# Patient Record
Sex: Male | Born: 1990 | Race: White | Hispanic: No | Marital: Single | Smoking: Never smoker
Health system: Southern US, Community
[De-identification: ages and names within clinical notes are randomized; demographics above are authoritative.]

## PROBLEM LIST (undated history)

## (undated) DIAGNOSIS — T7840XA Allergy, unspecified, initial encounter: Secondary | ICD-10-CM

## (undated) DIAGNOSIS — F10239 Alcohol dependence with withdrawal, unspecified: Secondary | ICD-10-CM

## (undated) DIAGNOSIS — R569 Unspecified convulsions: Secondary | ICD-10-CM

## (undated) DIAGNOSIS — F10939 Alcohol use, unspecified with withdrawal, unspecified: Secondary | ICD-10-CM

## (undated) DIAGNOSIS — L709 Acne, unspecified: Secondary | ICD-10-CM

## (undated) DIAGNOSIS — F988 Other specified behavioral and emotional disorders with onset usually occurring in childhood and adolescence: Secondary | ICD-10-CM

## (undated) HISTORY — DX: Other specified behavioral and emotional disorders with onset usually occurring in childhood and adolescence: F98.8

## (undated) HISTORY — DX: Allergy, unspecified, initial encounter: T78.40XA

## (undated) HISTORY — DX: Acne, unspecified: L70.9

---
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
## 2009-05-20 ENCOUNTER — Telehealth: Payer: Self-pay | Admitting: Family Medicine

## 2010-01-23 ENCOUNTER — Ambulatory Visit: Payer: Self-pay | Admitting: Family Medicine

## 2010-06-25 ENCOUNTER — Telehealth: Payer: Self-pay | Admitting: *Deleted

## 2010-07-14 NOTE — Assessment & Plan Note (Signed)
Summary: collge cpx//ccm   Vital Signs:  Patient profile:   20 year old male Height:      69 inches Weight:      146 pounds BMI:     21.64 Temp:     98.2 degrees F oral BP sitting:   120 / 80  (left arm)  Vitals Entered By: Kathrynn Speed CMA (January 23, 2010 2:57 PM) CC: cpx, src Is Patient Diabetic? No   CC:  cpx and src.  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  He has underlying ADD for which he takes vyvanse 50 mg daily.  Medication working well.  No side effects.  Review of systems negative  Current Medications (verified): 1)  Vyvanse 50 Mg Caps (Lisdexamfetamine Dimesylate) .... Take 1 Cap  By Mouth Every Morning 2)  Vyvanse 50 Mg Caps (Lisdexamfetamine Dimesylate) .... Take One Cap By Mouth Every Morning  Fill in One Month 3)  Vyvanse 50 Mg Caps (Lisdexamfetamine Dimesylate) .... Take One Cap By Mouth Every Morning Fill in Two Months  Allergies (verified): No Known Drug Allergies  Past History:  Past medical, surgical, family and social histories (including risk factors) reviewed, and no changes noted (except as noted below).  Past Medical History: Reviewed history from 05/30/2008 and no changes required. seasonal allergies ADD adult acne  Family History: Reviewed history from 05/30/2008 and no changes required. Father: healthy Mother: healthy Siblings: healthy  Social History: Reviewed history from 05/30/2008 and no changes required. Occupation:full time student Single Never Smoked Alcohol use-yes Regular exercise-yes  Review of Systems      See HPI  Physical Exam  General:  Well-developed,well-nourished,in no acute distress; alert,appropriate and cooperative throughout examination Head:  Normocephalic and atraumatic without obvious abnormalities. No apparent alopecia or balding. Eyes:  No corneal or conjunctival  inflammation noted. EOMI. Perrla. Funduscopic exam benign, without hemorrhages, exudates or papilledema. Vision grossly normal. Ears:  External ear exam shows no significant lesions or deformities.  Otoscopic examination reveals clear canals, tympanic membranes are intact bilaterally without bulging, retraction, inflammation or discharge. Hearing is grossly normal bilaterally. Nose:  External nasal examination shows no deformity or inflammation. Nasal mucosa are pink and moist without lesions or exudates. Mouth:  Oral mucosa and oropharynx without lesions or exudates.  Teeth in good repair. Neck:  No deformities, masses, or tenderness noted. Chest Wall:  No deformities, masses, tenderness or gynecomastia noted. Breasts:  No masses or gynecomastia noted Lungs:  Normal respiratory effort, chest expands symmetrically. Lungs are clear to auscultation, no crackles or wheezes. Heart:  Normal rate and regular rhythm. S1 and S2 normal without gallop, murmur, click, rub or other extra sounds. Abdomen:  Bowel sounds positive,abdomen soft and non-tender without masses, organomegaly or hernias noted. Msk:  No deformity or scoliosis noted of thoracic or lumbar spine.   Pulses:  R and L carotid,radial,femoral,dorsalis pedis and posterior tibial pulses are full and equal bilaterally Extremities:  No clubbing, cyanosis, edema, or deformity noted with normal full range of motion of all joints.   Neurologic:  No cranial nerve deficits noted. Station and gait are normal. Plantar reflexes are down-going bilaterally. DTRs are symmetrical throughout. Sensory, motor and coordinative functions appear intact. Psych:  Cognition and judgment appear intact. Alert and cooperative with normal attention span and concentration. No apparent delusions, illusions, hallucinations   Impression & Recommendations:  Problem # 1:  ATTENTION DEFICIT DISORDER, ADULT (  ICD-314.00) Assessment Improved  Complete Medication List: 1)   Vyvanse 50 Mg Caps (Lisdexamfetamine dimesylate) .... Take 1 cap  by mouth every morning 2)  Vyvanse 50 Mg Caps (Lisdexamfetamine dimesylate) .... Take one cap by mouth every morning  fill in one month 3)  Vyvanse 50 Mg Caps (Lisdexamfetamine dimesylate) .... Take one cap by mouth every morning fill in two months  Patient Instructions: 1)  continue current medications.  Call when you are two weeks from being out of your third prescription for refills 2)  Please schedule a follow-up appointment in 1 year. Prescriptions: VYVANSE 50 MG CAPS (LISDEXAMFETAMINE DIMESYLATE) take one cap by mouth every morning fill in two months  #30 x 0   Entered and Authorized by:   Roderick Pee MD   Signed by:   Roderick Pee MD on 01/23/2010   Method used:   Print then Give to Patient   RxID:   [id number] VYVANSE 50 MG CAPS (LISDEXAMFETAMINE DIMESYLATE) take one cap by mouth every morning  fill in one month  #30 x 0   Entered and Authorized by:   Roderick Pee MD   Signed by:   Roderick Pee MD on 01/23/2010   Method used:   Print then Give to Patient   RxID:   [id number] VYVANSE 50 MG CAPS (LISDEXAMFETAMINE DIMESYLATE) Take 1 cap  by mouth every morning  #30 x 0   Entered and Authorized by:   Roderick Pee MD   Signed by:   Roderick Pee MD on 01/23/2010   Method used:   Print then Give to Patient   RxID:   [id number]

## 2010-07-16 NOTE — Progress Notes (Signed)
Summary: SWITCH TO ADDERALL  Phone Note Refill Request Call back at [phone] Message from:  Patient-MOM LOUISE  PT WOULD LIKE TO BACK TO ADDERALL NOT XR  Initial call taken by: Heron Sabins,  June 25, 2010 11:09 AM  Follow-up for Phone Call        Adderall 20 mg b.i.d., dispense 60 tabs follow-up in two weeks after starting new medication Follow-up by: Roderick Pee MD,  June 25, 2010 11:23 AM  Additional Follow-up for Phone Call Additional follow up Details #1::        Pt aware that rx is ready to pick up and will let pt know about calling in 2 weeks after starting the medication. Additional Follow-up by: Romualdo Bolk, CMA (AAMA),  June 25, 2010 2:16 PM    New/Updated Medications: ADDERALL 20 MG TABS (AMPHETAMINE-DEXTROAMPHETAMINE) 1 by mouth two times a day Prescriptions: ADDERALL 20 MG TABS (AMPHETAMINE-DEXTROAMPHETAMINE) 1 by mouth two times a day  #60 x 0   Entered by:   Romualdo Bolk, CMA (AAMA)   Authorized by:   Roderick Pee MD   Signed by:   Romualdo Bolk, CMA (AAMA) on 06/25/2010   Method used:   Print then Give to Patient   RxID:   [id number]

## 2010-09-03 ENCOUNTER — Telehealth: Payer: Self-pay | Admitting: Family Medicine

## 2010-09-03 NOTE — Telephone Encounter (Signed)
-   Refill Adderall

## 2010-09-07 MED ORDER — AMPHETAMINE-DEXTROAMPHETAMINE 20 MG PO TABS: 20.0000 mg | ORAL_TABLET | Freq: Two times a day (BID) | ORAL | Status: DC

## 2011-04-05 ENCOUNTER — Telehealth: Payer: Self-pay | Admitting: Family Medicine

## 2011-04-05 MED ORDER — AMPHETAMINE-DEXTROAMPHETAMINE 20 MG PO TABS: 20.0000 mg | ORAL_TABLET | Freq: Two times a day (BID) | ORAL | Status: DC

## 2011-04-05 NOTE — Telephone Encounter (Signed)
Please refill Adderall. Thanks.

## 2011-04-05 NOTE — Telephone Encounter (Signed)
rx ready for pick up and Left message on machine for patient 

## 2011-08-17 ENCOUNTER — Other Ambulatory Visit: Payer: Self-pay | Admitting: Family Medicine

## 2011-08-17 MED ORDER — AMPHETAMINE-DEXTROAMPHETAMINE 20 MG PO TABS: 20.0000 mg | ORAL_TABLET | Freq: Two times a day (BID) | ORAL | Status: DC

## 2011-08-17 NOTE — Telephone Encounter (Signed)
Ok   1 month

## 2011-08-17 NOTE — Telephone Encounter (Signed)
Pt needs new rx generic adderall 20 mg °

## 2011-08-17 NOTE — Telephone Encounter (Signed)
Patient has not been seen since 2011 okay to fill?

## 2011-08-17 NOTE — Telephone Encounter (Signed)
Left detailed message on machine for Mom that an office visit is required for more refills and Rx is ready for pick up

## 2012-01-05 ENCOUNTER — Encounter: Payer: Self-pay | Admitting: Family Medicine

## 2012-01-10 ENCOUNTER — Encounter: Payer: Self-pay | Admitting: Family Medicine

## 2012-01-10 ENCOUNTER — Ambulatory Visit (INDEPENDENT_AMBULATORY_CARE_PROVIDER_SITE_OTHER): Payer: BC Managed Care – PPO | Admitting: Family Medicine

## 2012-01-10 VITALS — BP 120/80 | Temp 98.3°F | Ht 70.0 in | Wt 153.0 lb

## 2012-01-10 DIAGNOSIS — F988 Other specified behavioral and emotional disorders with onset usually occurring in childhood and adolescence: Secondary | ICD-10-CM

## 2012-01-10 MED ORDER — AMPHETAMINE-DEXTROAMPHETAMINE 20 MG PO TABS: 20.0000 mg | ORAL_TABLET | Freq: Two times a day (BID) | ORAL | Status: DC

## 2012-01-10 NOTE — Progress Notes (Signed)
  Subjective:    Patient ID: Jack Gonzales, male    DOB: 1991/01/04, 21 y.o.   MRN: [id number]  HPI Jack Gonzales is a . 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  He takes 20 mg twice a day and is doing well and wishes to continue that dose no side effects. He sleeps well.    Review of Systems    general and psychiatric review of systems otherwise negative Objective:   Physical Exam Well-developed well-nourished male in no acute distress       Assessment & Plan:

## 2012-01-10 NOTE — Patient Instructions (Addendum)
Continue the Adderall 20 mg twice daily  When you are 2 weeks from being out of your third prescription call and leave a voicemail for Fleet Contras that you need refills.

## 2012-07-18 ENCOUNTER — Other Ambulatory Visit: Payer: Self-pay | Admitting: Family Medicine

## 2012-07-18 DIAGNOSIS — F988 Other specified behavioral and emotional disorders with onset usually occurring in childhood and adolescence: Secondary | ICD-10-CM

## 2012-07-18 MED ORDER — AMPHETAMINE-DEXTROAMPHETAMINE 20 MG PO TABS: 20.0000 mg | ORAL_TABLET | Freq: Two times a day (BID) | ORAL | Status: DC

## 2012-07-18 MED ORDER — AMPHETAMINE-DEXTROAMPHETAMINE 20 MG PO TABS: 20.0000 mg | ORAL_TABLET | Freq: Two times a day (BID) | ORAL | Status: AC

## 2012-07-18 NOTE — Telephone Encounter (Signed)
Rx ready for pick up.  Left message on machine for patient. 

## 2012-07-18 NOTE — Telephone Encounter (Signed)
Pt needs new rx generic adderall 20 mg. Pt gets 3 rxs at a time

## 2014-08-20 ENCOUNTER — Telehealth: Payer: Self-pay | Admitting: Family Medicine

## 2014-08-21 NOTE — Telephone Encounter (Signed)
Error/njr °

## 2019-02-17 ENCOUNTER — Other Ambulatory Visit: Payer: Self-pay

## 2019-02-17 ENCOUNTER — Inpatient Hospital Stay (HOSPITAL_COMMUNITY)
Admission: EM | Admit: 2019-02-17 | Discharge: 2019-02-22 | DRG: 057 | Disposition: A | Discharge status: Home / Self Care | Payer: Self-pay | Attending: Internal Medicine | Admitting: Internal Medicine

## 2019-02-17 ENCOUNTER — Encounter (HOSPITAL_COMMUNITY): Payer: Self-pay

## 2019-02-17 ENCOUNTER — Emergency Department (HOSPITAL_COMMUNITY): Payer: Self-pay

## 2019-02-17 DIAGNOSIS — F10230 Alcohol dependence with withdrawal, uncomplicated: Secondary | ICD-10-CM

## 2019-02-17 DIAGNOSIS — E876 Hypokalemia: Secondary | ICD-10-CM

## 2019-02-17 DIAGNOSIS — F1010 Alcohol abuse, uncomplicated: Secondary | ICD-10-CM

## 2019-02-17 DIAGNOSIS — G312 Degeneration of nervous system due to alcohol: Principal | ICD-10-CM | POA: Diagnosis present

## 2019-02-17 DIAGNOSIS — K7010 Alcoholic hepatitis without ascites: Secondary | ICD-10-CM | POA: Diagnosis present

## 2019-02-17 DIAGNOSIS — F988 Other specified behavioral and emotional disorders with onset usually occurring in childhood and adolescence: Secondary | ICD-10-CM

## 2019-02-17 DIAGNOSIS — I4510 Unspecified right bundle-branch block: Secondary | ICD-10-CM | POA: Diagnosis present

## 2019-02-17 DIAGNOSIS — R569 Unspecified convulsions: Secondary | ICD-10-CM

## 2019-02-17 DIAGNOSIS — Z20828 Contact with and (suspected) exposure to other viral communicable diseases: Secondary | ICD-10-CM | POA: Diagnosis present

## 2019-02-17 DIAGNOSIS — F10931 Alcohol use, unspecified with withdrawal delirium: Secondary | ICD-10-CM

## 2019-02-17 DIAGNOSIS — F10231 Alcohol dependence with withdrawal delirium: Secondary | ICD-10-CM | POA: Diagnosis present

## 2019-02-17 DIAGNOSIS — Z803 Family history of malignant neoplasm of breast: Secondary | ICD-10-CM

## 2019-02-17 HISTORY — DX: Unspecified convulsions: R56.9

## 2019-02-17 HISTORY — DX: Alcohol dependence with withdrawal, unspecified: F10.239

## 2019-02-17 HISTORY — DX: Alcohol use, unspecified with withdrawal, unspecified: F10.939

## 2019-02-17 LAB — BASIC METABOLIC PANEL
Anion gap: 15 (ref 5–15)
BUN: 15 mg/dL (ref 6–20)
CO2: 25 mmol/L (ref 22–32)
Calcium: 9.2 mg/dL (ref 8.9–10.3)
Chloride: 96 mmol/L — ABNORMAL LOW (ref 98–111)
Creatinine, Ser: 0.73 mg/dL (ref 0.61–1.24)
GFR calc Af Amer: >60 mL/min (ref >60)
GFR calc non Af Amer: >60 mL/min (ref >60)
Glucose, Bld: 85 mg/dL (ref 70–99)
Potassium: 3.3 mmol/L — ABNORMAL LOW (ref 3.5–5.1)
Sodium: 136 mmol/L (ref 135–145)

## 2019-02-17 LAB — HEPATIC FUNCTION PANEL
ALT: 57 U/L — ABNORMAL HIGH (ref 0–44)
AST: 72 U/L — ABNORMAL HIGH (ref 15–41)
Albumin: 5.3 g/dL — ABNORMAL HIGH (ref 3.5–5.0)
Alkaline Phosphatase: 53 U/L (ref 38–126)
Bilirubin, Direct: 0.2 mg/dL (ref 0.0–0.2)
Indirect Bilirubin: 1.4 mg/dL — ABNORMAL HIGH (ref 0.3–0.9)
Total Bilirubin: 1.6 mg/dL — ABNORMAL HIGH (ref 0.3–1.2)
Total Protein: 8.6 g/dL — ABNORMAL HIGH (ref 6.5–8.1)

## 2019-02-17 LAB — RAPID URINE DRUG SCREEN, HOSP PERFORMED
Amphetamines: POSITIVE — AB
Barbiturates: NOT DETECTED
Benzodiazepines: NOT DETECTED
Cocaine: NOT DETECTED
Opiates: NOT DETECTED
Tetrahydrocannabinol: NOT DETECTED

## 2019-02-17 LAB — CBC
HCT: 41.3 % (ref 39.0–52.0)
Hemoglobin: 13.9 g/dL (ref 13.0–17.0)
MCH: 32.6 pg (ref 26.0–34.0)
MCHC: 33.7 g/dL (ref 30.0–36.0)
MCV: 96.9 fL (ref 80.0–100.0)
Platelets: 131 10*3/uL — ABNORMAL LOW (ref 150–400)
RBC: 4.26 MIL/uL (ref 4.22–5.81)
RDW: 11.9 % (ref 11.5–15.5)
WBC: 6.6 10*3/uL (ref 4.0–10.5)
nRBC: 0 % (ref 0.0–0.2)

## 2019-02-17 LAB — CBG MONITORING, ED: Glucose-Capillary: 85 mg/dL (ref 70–99)

## 2019-02-17 LAB — MAGNESIUM: Magnesium: 2.1 mg/dL (ref 1.7–2.4)

## 2019-02-17 LAB — LIPASE, BLOOD: Lipase: 34 U/L (ref 11–51)

## 2019-02-17 LAB — ETHANOL: Alcohol, Ethyl (B): <10 mg/dL (ref <10)

## 2019-02-17 MED ORDER — ENOXAPARIN SODIUM 40 MG/0.4ML ~~LOC~~ SOLN
40.0000 mg | SUBCUTANEOUS | Status: DC
  Administered 2019-02-17 – 2019-02-21 (×5): 40 mg via SUBCUTANEOUS
  Filled 2019-02-17 (×5): qty 0.4

## 2019-02-17 MED ORDER — LORAZEPAM 2 MG/ML IJ SOLN
0.0000 mg | Freq: Four times a day (QID) | INTRAMUSCULAR | Status: DC
  Administered 2019-02-17: 16:00:00 1 mg via INTRAVENOUS
  Filled 2019-02-17: qty 1

## 2019-02-17 MED ORDER — THIAMINE HCL 100 MG/ML IJ SOLN
100.0000 mg | Freq: Every day | INTRAMUSCULAR | Status: DC
  Filled 2019-02-17: qty 2

## 2019-02-17 MED ORDER — LORAZEPAM 1 MG PO TABS: 0.0000 mg | ORAL_TABLET | Freq: Two times a day (BID) | ORAL | Status: DC

## 2019-02-17 MED ORDER — LORAZEPAM 2 MG/ML IJ SOLN: 0.0000 mg | Freq: Two times a day (BID) | INTRAMUSCULAR | Status: DC

## 2019-02-17 MED ORDER — ACETAMINOPHEN 650 MG RE SUPP: 650.0000 mg | RECTAL | Status: DC | PRN

## 2019-02-17 MED ORDER — CHLORDIAZEPOXIDE HCL 25 MG PO CAPS: ORAL_CAPSULE | ORAL | 0 refills | Status: DC

## 2019-02-17 MED ORDER — POTASSIUM CHLORIDE IN NACL 20-0.9 MEQ/L-% IV SOLN
INTRAVENOUS | Status: AC
  Administered 2019-02-17 – 2019-02-18 (×2): via INTRAVENOUS
  Filled 2019-02-17 (×2): qty 1000

## 2019-02-17 MED ORDER — LORAZEPAM 1 MG PO TABS
0.0000 mg | ORAL_TABLET | Freq: Four times a day (QID) | ORAL | Status: DC
  Administered 2019-02-17: 2 mg via ORAL
  Filled 2019-02-17: qty 2

## 2019-02-17 MED ORDER — ADULT MULTIVITAMIN W/MINERALS CH
1.0000 | ORAL_TABLET | Freq: Every day | ORAL | Status: DC
  Administered 2019-02-17 – 2019-02-22 (×6): 1 via ORAL
  Filled 2019-02-17 (×6): qty 1

## 2019-02-17 MED ORDER — VITAMIN B-1 100 MG PO TABS
100.0000 mg | ORAL_TABLET | Freq: Every day | ORAL | Status: DC
  Administered 2019-02-17 – 2019-02-22 (×6): 100 mg via ORAL
  Filled 2019-02-17 (×7): qty 1

## 2019-02-17 MED ORDER — POTASSIUM CHLORIDE CRYS ER 20 MEQ PO TBCR
40.0000 meq | EXTENDED_RELEASE_TABLET | Freq: Once | ORAL | Status: AC
  Administered 2019-02-17: 40 meq via ORAL
  Filled 2019-02-17: qty 2

## 2019-02-17 MED ORDER — LORAZEPAM 2 MG/ML IJ SOLN
2.0000 mg | Freq: Once | INTRAMUSCULAR | Status: DC
  Filled 2019-02-17: qty 1

## 2019-02-17 MED ORDER — DIAZEPAM 5 MG/ML IJ SOLN
INTRAMUSCULAR | Status: AC
  Filled 2019-02-17: qty 2

## 2019-02-17 MED ORDER — DIAZEPAM 5 MG/ML IJ SOLN
10.0000 mg | Freq: Once | INTRAMUSCULAR | Status: AC | PRN
  Administered 2019-02-19: 10 mg via INTRAVENOUS
  Filled 2019-02-17: qty 2

## 2019-02-17 MED ORDER — ACETAMINOPHEN 325 MG PO TABS: 650.0000 mg | ORAL_TABLET | ORAL | Status: DC | PRN

## 2019-02-17 MED ORDER — SODIUM CHLORIDE 0.9 % IV BOLUS
1000.0000 mL | Freq: Once | INTRAVENOUS | Status: AC
  Administered 2019-02-17: 1000 mL via INTRAVENOUS

## 2019-02-17 MED ORDER — LORAZEPAM 2 MG/ML IJ SOLN
2.0000 mg | INTRAMUSCULAR | Status: DC | PRN
  Administered 2019-02-17: 2 mg via INTRAVENOUS

## 2019-02-17 MED ORDER — DIAZEPAM 5 MG/ML IJ SOLN
5.0000 mg | Freq: Once | INTRAMUSCULAR | Status: AC
  Administered 2019-02-17: 5 mg via INTRAVENOUS
  Filled 2019-02-17: qty 2

## 2019-02-17 MED ORDER — DEXMEDETOMIDINE HCL IN NACL 200 MCG/50ML IV SOLN
0.2000 ug/kg/h | INTRAVENOUS | Status: DC
  Administered 2019-02-17 – 2019-02-19 (×3): 0.2 ug/kg/h via INTRAVENOUS
  Filled 2019-02-17 (×3): qty 50

## 2019-02-17 MED ORDER — FOLIC ACID 1 MG PO TABS
1.0000 mg | ORAL_TABLET | Freq: Every day | ORAL | Status: DC
  Administered 2019-02-17 – 2019-02-22 (×6): 1 mg via ORAL
  Filled 2019-02-17 (×6): qty 1

## 2019-02-17 MED ORDER — LORAZEPAM 2 MG/ML IJ SOLN
2.0000 mg | Freq: Two times a day (BID) | INTRAMUSCULAR | Status: DC
  Administered 2019-02-17 – 2019-02-18 (×3): 2 mg via INTRAVENOUS
  Filled 2019-02-17 (×3): qty 1

## 2019-02-17 NOTE — ED Provider Notes (Signed)
Medical screening examination/treatment/procedure(s) were conducted as a shared visit with non-physician practitioner(s) and myself.  I personally evaluated the patient during the encounter.  EKG Interpretation  Date/Time:  Saturday February 17 2019 13:56:23 EDT Ventricular Rate:  101 PR Interval:    QRS Duration: 115 QT Interval:  373 QTC Calculation: 484 R Axis:   90 Text Interpretation:  Sinus tachycardia Incomplete right bundle branch block ST elev, probable normal early repol pattern No old tracing to compare Confirmed by Dorie Rank [id number]) on 02/17/2019 2:07:03 PM  Patient presents with new onset seizure.  Patient denies any drug use.  He is on Adderall for ADHD.  Patient appears tremulous somewhat suggestive of alcohol withdrawal however he he states he drinks 1 or 2 alcoholic beverages every other day.  He denies heavy alcohol use.  Will check labs, CT, monitor.   Dorie Rank, MD 02/17/19 1450

## 2019-02-17 NOTE — Progress Notes (Signed)
Spoke with patient's mom via phone. Updated on patient's condition and all questions answered. Notified her that patient is in soft wrist and soft belt restraints for his own safety due to him attempting to get out of bed multiple times. Very unsteady gait & severe tremors noted. Less restrictive attempts done with no success (see restraint charting). Patient's mom expressed understanding. Will continue to monitor patient.

## 2019-02-17 NOTE — ED Notes (Signed)
Pt states does not have the urge to urinate at the current moment. Urinal provided for collection of urine sample

## 2019-02-17 NOTE — ED Triage Notes (Signed)
Patient arrived via POV with girlfriend. Patient had seizure lasting about 2.5 minutes and does not have history of seizures. Patient hit head on root following seizure. Patient also began to have stomach pain which began this past Wednesday, and continued up to seizure. Girlfriend at bedside. Patient is Alert and oriented at this time.

## 2019-02-17 NOTE — ED Provider Notes (Signed)
Pt has been admitted to the medical service.  Pt seems to be getting more confused.  He is standing up at the bedside trying to pick at his leads.  Pt instructed to get back in bed.  2 mg ativan IV  Ordered.  Asked RN to contact the admitting MD, Dr Posey Pronto.  Pt will likely need to be moved up to ICU care.   Dorie Rank, MD 02/17/19 2033

## 2019-02-17 NOTE — H&P (Signed)
History and Physical    Jack Gonzales RSW:[id number] DOB: [birth year] DOA: 02/17/2019  PCP: Merleen Nicely, MD  Patient coming from: Home  I have personally briefly reviewed patient's old medical records in [place]  Chief Complaint: Seizure activity  HPI: Jack Gonzales is a 28 y.o. male with medical history significant for ADD who presents the ED for evaluation after reported seizure episode.  Patient states he was in the [place] camping.  Last night he began to notice having shakes with noticeable tremors in his hands.  This morning he woke up and was walking outside when he began to feel off balance.  He says he turned around talk to his girlfriend when he suddenly fell to the ground.  He was reportedly witnessed to have vomiting and regurgitation in his mouth and diffuse conversations of his body.  This apparently lasted for approximately 2 minutes before he woke up.  Upon waking he felt confused and was saying nonsensical things.  He denied any loss of bowel or bladder control.  He does report recent possible auditory hallucinations.  He reportedly hit his head on a large tree root when he fell.  He denies any personal history of seizures.  He has a history of ADD for which he has been taking Adderall since he was a child.  He reports alcohol use of approximately 3 drinks every other day with occasional increased consumption on social occasions.  He says his last drink was 2 days ago.  He denies any history of tobacco use.  ED Course:  Initial vitals show BP 135/96, pulse 100, RR 20, temp 98.0 Fahrenheit, SPO2 100% on room air.  Labs notable for potassium 3.3, magnesium 2.1, sodium 136, bicarb 25, BUN 15, creatinine 0.73, serum glucose 85, WBC 6.6, hemoglobin 13.9, platelets 131,000, AST 72, ALT 57, alk phos 53, total bilirubin 1.6, lipase 34.  Serum ethanol level undetectable.  UDS positive for amphetamines (patient is on Adderall).  CT head without contrast was negative for  acute infarction, hemorrhage, hydrocephalus, or mass lesion/mass-effect.  Patient was given 1 L normal saline, K-Dur 40 mEq, and IV Ativan 1 mg.  The hospitalist service was consulted admit for further evaluation management of suspected alcohol withdrawal seizure.  Review of Systems: All systems reviewed and are negative except as documented in history of present illness above.   Past Medical History:  Diagnosis Date  . ADD (attention deficit disorder)   . Adult acne   . Alcohol withdrawal seizure ([place])   . Allergy     History reviewed. No pertinent surgical history.  Social History:  reports that he has never smoked. He has never used smokeless tobacco. He reports current alcohol use. He reports that he does not use drugs.  No Known Allergies  Family History  Problem Relation Age of Onset  . Breast cancer Mother      Prior to Admission medications   Medication Sig Start Date End Date Taking? Authorizing Provider  amphetamine-dextroamphetamine (ADDERALL) 20 MG tablet Take 1 tablet (20 mg total) by mouth 2 (two) times daily. 07/18/12 02/17/19 Yes Dorena Cookey, MD  chlordiazePOXIDE (LIBRIUM) 25 MG capsule 51m PO TID x 1D, then 25-52mPO BID X 1D, then 25-506mO QD X 1D 02/17/19   VenEustaquio MaizeA-C    Physical Exam: Vitals:   02/17/19 1800 02/17/19 1830 02/17/19 1900 02/17/19 1951  BP: 106/70 (!) 152/98 (!) 154/108 (!) 150/99  Pulse: 94  92 (!) 109  Resp:  14 15 16  (!) 22  Temp:      TempSrc:      SpO2: 99% 100% 100% 100%  Weight:      Height:        Constitutional: Resting in bed, tremulous, NAD, calm, comfortable Eyes: PERRL, EOMI, lids and conjunctivae normal ENMT: Mucous membranes are moist. Posterior pharynx clear of any exudate or lesions.Normal dentition.  Neck: normal, supple, no masses. Respiratory: clear to auscultation bilaterally, no wheezing, no crackles. Normal respiratory effort. No accessory muscle use.  Cardiovascular: Regular rate and rhythm, no  murmurs / rubs / gallops. No extremity edema. 2+ pedal pulses. Abdomen: no tenderness, no masses palpated. No hepatosplenomegaly. Bowel sounds positive.  Musculoskeletal: no clubbing / cyanosis. No joint deformity upper and lower extremities. Good ROM, no contractures. Normal muscle tone.  Skin: Diaphoretic, multiple small lacerations/abrasions of the anterior lower legs Neurologic: CN 2-12 grossly intact. Sensation intact, Strength 5/5 in all 4, tremulous without dysmetria or dysdiadochokinesia.  Psychiatric: Alert and oriented x 3. Normal mood.     Labs on Admission: I have personally reviewed following labs and imaging studies  CBC: Recent Labs  Lab 02/17/19 1429  WBC 6.6  HGB 13.9  HCT 41.3  MCV 96.9  PLT 962*   Basic Metabolic Panel: Recent Labs  Lab 02/17/19 1429 02/17/19 1735  NA 136  --   K 3.3*  --   CL 96*  --   CO2 25  --   GLUCOSE 85  --   BUN 15  --   CREATININE 0.73  --   CALCIUM 9.2  --   MG  --  2.1   GFR: Estimated Creatinine Clearance: 139.9 mL/min (by C-G formula based on SCr of 0.73 mg/dL). Liver Function Tests: Recent Labs  Lab 02/17/19 1444  AST 72*  ALT 57*  ALKPHOS 53  BILITOT 1.6*  PROT 8.6*  ALBUMIN 5.3*   Recent Labs  Lab 02/17/19 1444  LIPASE 34   No results for input(s): AMMONIA in the last 168 hours. Coagulation Profile: No results for input(s): INR, PROTIME in the last 168 hours. Cardiac Enzymes: No results for input(s): CKTOTAL, CKMB, CKMBINDEX, TROPONINI in the last 168 hours. BNP (last 3 results) No results for input(s): PROBNP in the last 8760 hours. HbA1C: No results for input(s): HGBA1C in the last 72 hours. CBG: Recent Labs  Lab 02/17/19 1356  GLUCAP 85   Lipid Profile: No results for input(s): CHOL, HDL, LDLCALC, TRIG, CHOLHDL, LDLDIRECT in the last 72 hours. Thyroid Function Tests: No results for input(s): TSH, T4TOTAL, FREET4, T3FREE, THYROIDAB in the last 72 hours. Anemia Panel: No results for  input(s): VITAMINB12, FOLATE, FERRITIN, TIBC, IRON, RETICCTPCT in the last 72 hours. Urine analysis: No results found for: COLORURINE, APPEARANCEUR, LABSPEC, [place], GLUCOSEU, HGBUR, BILIRUBINUR, KETONESUR, PROTEINUR, UROBILINOGEN, NITRITE, LEUKOCYTESUR  Radiological Exams on Admission: Ct Head Wo Contrast  Result Date: 02/17/2019 CLINICAL DATA:  Seizure EXAM: CT HEAD WITHOUT CONTRAST TECHNIQUE: Contiguous axial images were obtained from the base of the skull through the vertex without intravenous contrast. COMPARISON:  None. FINDINGS: Brain: No evidence of acute infarction, hemorrhage, hydrocephalus, extra-axial collection or mass lesion/mass effect. Vascular: No hyperdense vessel or unexpected calcification. Skull: Normal. Negative for fracture or focal lesion. Sinuses/Orbits: Minimal mucosal thickening within the inferior left maxillary sinus. The visualized paranasal sinuses and mastoid air cells are otherwise clear. Orbital structures unremarkable. Other: None. IMPRESSION: No acute intracranial findings. Electronically Signed   By: Davina Poke M.D.   On:  02/17/2019 15:41    EKG: Independently reviewed. Sinus tachycardia, early repolarization changes, no prior for comparison.  Assessment/Plan Principal Problem:   Seizure [facility]) Active Problems:   ADD (attention deficit disorder)  GERELL FORTSON is a 28 y.o. male with medical history significant for ADD who is admitted after a first-time seizure episode.  Seizure: First episode without prior history. Questionable alcohol withdrawal induced seizure.  Patient is also on Adderall which can lower seizure threshold.  CT head was negative for acute intracranial abnormality.  Patient remains tremulous and diaphoretic.  Also becoming more confused.  Discussed with neurology, will schedule Ativan 2 mg IV every 12 hours, continue CIWA protocol with as needed Ativan, hold Adderall.  Patient will need to follow-up with neurology outpatient and should  not drive for neck 6 months. -Admit to stepdown unit -Schedule IV Ativan 2 mg every 12 hours -Continue CIWA protocol with as needed Ativan -Seizure precautions  Alcohol use: Continue CIWA protocol as above with thiamine, folate, multivitamin.  ADD: Has been taking Adderall 20 mg twice daily.  Will hold for now given seizure episode.  DVT prophylaxis: Lovenox Code Status: Full code, confirmed with patient Family Communication: Discussed with patient Disposition Plan: Pending clinical progress Consults called: Discussed with neurology by phone Admission status: Observation   Zada Finders MD [facility]  If 7PM-7AM, please contact night-coverage [email or web address]  02/17/2019, 8:51 PM

## 2019-02-17 NOTE — Discharge Instructions (Addendum)
Please follow up with your PCP regarding your new onset seizure. You may also follow up with your father's neurologist if they are able to get you in.

## 2019-02-17 NOTE — ED Provider Notes (Addendum)
[facility]-EMERGENCY DEPT Provider Note   CSN: [id number] Arrival date & time: 02/17/19  1323     History   Chief Complaint Chief Complaint  Patient presents with  . Seizures  . Abdominal Pain  . Fall    HPI Jack Gonzales is a 28 y.o. male with PMHx ADD who presents to the ED for new onset seizure.  Per girlfriend they were hiking last night.  When patient woke up this morning he got out of his tent and reports that he fell very off balance.  He began looking at girlfriend when she noted that he dropped to the ground.  Patient did hit his head on a large tree root.  She reports that after falling he began having diffuse convulsing of his body.  She indicates that he was doing this for about 2 minutes before coming to.  She states that after coming to patient seemed very confused.  He was not making much sense when he was talking.  She states that he appeared very drowsy and wanted to go back to sleep but she knew she needed to call 911 for help.  Patient reports that he felt "off" yesterday and has been having some intermittent periumbilical abdominal pain since Wednesday.  No nausea or vomiting, no diarrhea.  Has never had seizures in the past.  He is currently complaining of some mild headache but otherwise has no complaints.  Patient states that he does drink alcohol every other day.  He states he has not drank anything since about Wednesday.  He does endorse occasional marijuana use.  Denies any other illicit drugs.  No known recent sick contacts.  No new rashes.  No tick exposure.  Denies fever, chills, chest pain, shortness of breath, neck stiffness, any other associated symptoms.      The history is provided by the patient and a friend.    Past Medical History:  Diagnosis Date  . ADD (attention deficit disorder)   . Adult acne   . Allergy     Patient Active Problem List   Diagnosis Date Noted  . CLOSED FRACTURE OF NECK OF METACARPAL BONE 10/22/2008  .  CONTUSION, RIGHT HAND 10/19/2008  . ATTENTION DEFICIT DISORDER, ADULT 05/30/2008  . 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 No pertinent surgical history.      Home Medications    Prior to Admission medications   Medication Sig Start Date End Date Taking? Authorizing Provider  amphetamine-dextroamphetamine (ADDERALL) 20 MG tablet Take 1 tablet (20 mg total) by mouth 2 (two) times daily. 07/18/12 02/17/19 Yes Roderick Peeodd, Jeffrey A, MD    Family History History reviewed. No pertinent family history.  Social History Social History   Tobacco Use  . Smoking status: Never Smoker  . Smokeless tobacco: Never Used  Substance Use Topics  . Alcohol use: Yes    Comment: occasional  . Drug use: Never     Allergies   Patient has no known allergies.   Review of Systems Review of Systems  Constitutional: Negative for chills and fever.  HENT: Negative for congestion.   Eyes: Negative for visual disturbance.  Respiratory: Negative for cough and shortness of breath.   Cardiovascular: Negative for chest pain.  Gastrointestinal: Positive for abdominal pain. Negative for constipation, diarrhea, nausea and vomiting.  Genitourinary: Negative for difficulty urinating.  Musculoskeletal: Negative for myalgias.  Skin: Negative for rash.  Neurological: Positive for seizures and headaches.     Physical Exam Updated  Vital Signs BP (!) 151/101   Pulse 95   Temp 98 F (36.7 C) (Oral)   Resp 16   Ht 5\' 10"  (1.778 m)   Wt 72.6 kg   SpO2 100%   BMI 22.96 kg/m   Physical Exam Vitals signs and nursing note reviewed.  Constitutional:      Appearance: He is not ill-appearing.     Comments: Tremulous  HENT:     Head: Normocephalic and atraumatic.     Mouth/Throat:     Comments: No lesions noted to tongue or oral mucosa Eyes:     Extraocular Movements: Extraocular movements intact.     Conjunctiva/sclera: Conjunctivae normal.     Pupils: Pupils are equal, round, and reactive to  light.  Neck:     Musculoskeletal: Neck supple. No neck rigidity.     Meningeal: Brudzinski's sign and Kernig's sign absent.  Cardiovascular:     Rate and Rhythm: Normal rate and regular rhythm.     Heart sounds: Normal heart sounds.  Pulmonary:     Effort: Pulmonary effort is normal.     Breath sounds: Normal breath sounds. No wheezing, rhonchi or rales.  Abdominal:     Palpations: Abdomen is soft.     Tenderness: There is no abdominal tenderness.     Comments: Soft, NTND, +BS throughout, no r/g/r, neg murphy's, neg mcburney's, no CVA TTP   Skin:    General: Skin is warm and dry.  Neurological:     Mental Status: He is alert.     Comments: CN 3-12 grossly intact A&O x4; does hesitate when asking about the year but eventually states that it is 2020. GCS 15 Sensation and strength intact Coordination with finger-to-nose WNL although pt is having tremor with coordinating hand movements Neg pronator drift       ED Treatments / Results  Labs (all labs ordered are listed, but only abnormal results are displayed) Labs Reviewed  BASIC METABOLIC PANEL - Abnormal; Notable for the following components:      Result Value   Potassium 3.3 (*)    Chloride 96 (*)    All other components within normal limits  CBC - Abnormal; Notable for the following components:   Platelets 131 (*)    All other components within normal limits  HEPATIC FUNCTION PANEL - Abnormal; Notable for the following components:   Total Protein 8.6 (*)    Albumin 5.3 (*)    AST 72 (*)    ALT 57 (*)    Total Bilirubin 1.6 (*)    Indirect Bilirubin 1.4 (*)    All other components within normal limits  ETHANOL  LIPASE, BLOOD  RAPID URINE DRUG SCREEN, HOSP PERFORMED  MAGNESIUM  CBG MONITORING, ED    EKG EKG Interpretation  Date/Time:  Saturday February 17 2019 13:56:23 EDT Ventricular Rate:  101 PR Interval:    QRS Duration: 115 QT Interval:  373 QTC Calculation: 484 R Axis:   90 Text Interpretation:   Sinus tachycardia Incomplete right bundle branch block ST elev, probable normal early repol pattern No old tracing to compare Confirmed by Dorie Rank [phone]) on 02/17/2019 2:07:03 PM   Radiology Ct Head Wo Contrast  Result Date: 02/17/2019 CLINICAL DATA:  Seizure EXAM: CT HEAD WITHOUT CONTRAST TECHNIQUE: Contiguous axial images were obtained from the base of the skull through the vertex without intravenous contrast. COMPARISON:  None. FINDINGS: Brain: No evidence of acute infarction, hemorrhage, hydrocephalus, extra-axial collection or mass lesion/mass effect. Vascular: No hyperdense  vessel or unexpected calcification. Skull: Normal. Negative for fracture or focal lesion. Sinuses/Orbits: Minimal mucosal thickening within the inferior left maxillary sinus. The visualized paranasal sinuses and mastoid air cells are otherwise clear. Orbital structures unremarkable. Other: None. IMPRESSION: No acute intracranial findings. Electronically Signed   By: Duanne Guess M.D.   On: 02/17/2019 15:41    Procedures Procedures (including critical care time)  Medications Ordered in ED Medications  LORazepam (ATIVAN) injection 0-4 mg (1 mg Intravenous Given 02/17/19 1611)    Or  LORazepam (ATIVAN) tablet 0-4 mg ( Oral See Alternative 02/17/19 1611)  LORazepam (ATIVAN) injection 0-4 mg (has no administration in time range)    Or  LORazepam (ATIVAN) tablet 0-4 mg (has no administration in time range)  thiamine (VITAMIN B-1) tablet 100 mg (100 mg Oral Given 02/17/19 1634)    Or  thiamine (B-1) injection 100 mg ( Intravenous See Alternative 02/17/19 1634)  potassium chloride SA (K-DUR) CR tablet 40 mEq (40 mEq Oral Given 02/17/19 1634)  sodium chloride 0.9 % bolus 1,000 mL (1,000 mLs Intravenous New Bag/Given (Non-Interop) 02/17/19 1710)     Initial Impression / Assessment and Plan / ED Course  I have reviewed the triage vital signs and the nursing notes.  Pertinent labs & imaging results that were available during my  care of the patient were reviewed by me and considered in my medical decision making (see chart for details).    28 year old male who presents to the ED with new onset seizure.  Did hit head with fall.  Patient is tremulous on exam to bilateral upper and lower extremities.  Girl friend reports that this is new.  Endorses alcohol use every other day but none since last Wednesday.  That he typically drinks about 1-2 beers or mixed drink every other day.  Patient is on Adderall for his ADHD.  He also smokes marijuana occasionally.  Obtain baseline blood work today and CT head, UDS, EtOH as well.  We will continue to monitor in the ED today.  Regarding abdominal pain-he has no abdominal tenderness on exam today.  Will check CMP, lipase as well. Discussed case with attending physician Dr. Lynelle Doctor who agrees with plan at this time.   Lab work shows a mild hypokalemia at 3.3.  Will replete with K-Dur today.  No leukocytosis.  Hemoglobin stable.  No other electrolyte abnormalities.  Creatinine within normal limits.  AST/ALT and T bili very mildly elevated.  This consistent with patient's drinking history.  Call level today unremarkable.  Lipase negative.  Still awaiting UDS.  CT head negative.  Upon reevaluation of patient, patient's father is in the room.  He reports that they saw him last weekend and patient had some tremors at that point although it appears much worse today.  Father reports that this has been somewhat of an ongoing issue for "a while".  Denies any recent increase or decrease in his Adderall.  No recent medication changes.  All in the room patient's heart rate tachycardic into the 110s.  Will give IV fluids at this time.  Patient has already received some Ativan and thiamine per CIWA protocol.    We will continue to monitor patient/continue tracking CIWA score and given meds as needed.  If heart rate is able to decrease back into the 90s feel patient is safe for discharge.  Patient lives in the  [place] area.  Father reports that they know a neurologist in the area that they can follow-up with.  Feel this  is appropriate.  Patient also has a PCP and I have recommended they can follow-up with them as well.   5:19 PM At shift change case signed out to Lyndel SafeElizabeth Hammond, PA-C, who will dispo patient accordingly. Awaiting UDS at this time. Pt receiving fluids currently for elevated HR. He will need [place] [facility] [facility] [place] [facility] or his father's neurologist. Have prescribed librium taper for patient.           Final Clinical Impressions(s) / ED Diagnoses   Final diagnoses:  Seizure (HCC)  Hypokalemia    ED Discharge Orders         Ordered    chlordiazePOXIDE (LIBRIUM) 25 MG capsule     Pending           Tanda RockersVenter, Teren Franckowiak, PA-C 02/17/19 1720    Tanda RockersVenter, Hara Milholland, PA-C 02/17/19 Lunette Stands1723    Knapp, Jon, MD 02/26/19 1025

## 2019-02-17 NOTE — ED Provider Notes (Signed)
I assumed care of patient [facility] Venter PA-C, please see her note for full H&P.  Briefly patient presents for new onset seizure.  When patient got up this morning he had a seizure for approximately 2 minutes.  He does not have a history.    Initially he had denied alcohol use.  When I asked his family to step out we discussed that his lab values appear consistent with alcohol consumption and asked him if it was possible that he actually drinks more than he had previously stated.  He reports that he does drink more than 2 drinks every day.  He states that in the past he has gotten shakes and felt anxious when he stops drinking however denies any previous seizures.    He requested that his alcohol use be not discussed in front of his family.  After discussions with patient I had his father stop by again and told him that there were lab abnormalities raising concern that he may have additional seizures and that he would be admitted.  I conveyed this to patient's primary nurse.    Physical Exam  BP 140/74   Pulse 100   Temp 98 F (36.7 C) (Oral)   Resp 19   Ht 5\' 10"  (1.778 m)   Wt 72.6 kg   SpO2 99%   BMI 22.96 kg/m   Physical Exam Constitutional:      General: He is not in acute distress.    Appearance: He is not diaphoretic.  Cardiovascular:     Rate and Rhythm: Tachycardia present.  Skin:    General: Skin is warm and dry.  Neurological:     Mental Status: He is alert.     Comments: Very tremulous     ED Course/Procedures   Clinical Course as of Feb 18 108  Sat Feb 17, 2019  1739 [facility].6 [JK]  1840 Spoke with hospitalist for admission.  Conveyed patient's request that his alcohol use not be talked about in front of his family.   [EH]    Clinical Course User Index [EH] Cristina GongHammond, Nayely Dingus W, PA-C [JK] Linwood DibblesKnapp, Jon, MD    Procedures   Ct Head Wo Contrast  Result Date: 02/17/2019 CLINICAL DATA:  Seizure EXAM: CT HEAD WITHOUT CONTRAST TECHNIQUE: Contiguous axial  images were obtained from the base of the skull through the vertex without intravenous contrast. COMPARISON:  None. FINDINGS: Brain: No evidence of acute infarction, hemorrhage, hydrocephalus, extra-axial collection or mass lesion/mass effect. Vascular: No hyperdense vessel or unexpected calcification. Skull: Normal. Negative for fracture or focal lesion. Sinuses/Orbits: Minimal mucosal thickening within the inferior left maxillary sinus. The visualized paranasal sinuses and mastoid air cells are otherwise clear. Orbital structures unremarkable. Other: None. IMPRESSION: No acute intracranial findings. Electronically Signed   By: Duanne GuessNicholas  Plundo M.D.   On: 02/17/2019 15:41    Labs Reviewed  BASIC METABOLIC PANEL - Abnormal; Notable for the following components:      Result Value   Potassium 3.3 (*)    Chloride 96 (*)    All other components within normal limits  CBC - Abnormal; Notable for the following components:   Platelets 131 (*)    All other components within normal limits  HEPATIC FUNCTION PANEL - Abnormal; Notable for the following components:   Total Protein 8.6 (*)    Albumin 5.3 (*)    AST 72 (*)    ALT 57 (*)    Total Bilirubin 1.6 (*)    Indirect Bilirubin 1.4 (*)  All other components within normal limits  RAPID URINE DRUG SCREEN, HOSP PERFORMED - Abnormal; Notable for the following components:   Amphetamines POSITIVE (*)    All other components within normal limits  ETHANOL  LIPASE, BLOOD  MAGNESIUM  CBG MONITORING, ED     MDM   Patient presents today for evaluation of a first-time seizure.  With previous provider he denies significant alcohol use, however he remains tremulous.  UDS is positive for amphetamines and PMP review shows that he has prescription for Adderall.  Labs are concerning for alcohol use given mild elevations in AST, ALT, total bilirubin.  His platelets are also slightly low.  With family out of the room I discussed the risks of untreated  alcohol withdrawal and he admitted that he consumes a fair amount of alcohol and has gotten tremulous without drinking before.  His last drink was Thursday.  He requested that his family members not be made aware of his alcohol use.  Family was told he had lab abnormalities that raised concern for increased seizures.  I also recommended that patient send his family members home and told patient's primary RN.    Hospitalist consulted and agreed to admit patient.         Lorin Glass, PA-C 02/18/19 Warden Fillers, MD 02/18/19 [phone]

## 2019-02-17 NOTE — ED Notes (Signed)
Pt is eating food from tray provided by dietary, seems to be tolerating, will continue to monitor.

## 2019-02-18 DIAGNOSIS — E876 Hypokalemia: Secondary | ICD-10-CM

## 2019-02-18 DIAGNOSIS — F10230 Alcohol dependence with withdrawal, uncomplicated: Secondary | ICD-10-CM

## 2019-02-18 DIAGNOSIS — R569 Unspecified convulsions: Secondary | ICD-10-CM

## 2019-02-18 DIAGNOSIS — F988 Other specified behavioral and emotional disorders with onset usually occurring in childhood and adolescence: Secondary | ICD-10-CM

## 2019-02-18 DIAGNOSIS — F1010 Alcohol abuse, uncomplicated: Secondary | ICD-10-CM

## 2019-02-18 LAB — CBC WITH DIFFERENTIAL/PLATELET
Abs Immature Granulocytes: 0.01 10*3/uL (ref 0.00–0.07)
Basophils Absolute: 0 10*3/uL (ref 0.0–0.1)
Basophils Relative: 0 %
Eosinophils Absolute: 0.1 10*3/uL (ref 0.0–0.5)
Eosinophils Relative: 2 %
HCT: 39.2 % (ref 39.0–52.0)
Hemoglobin: 13 g/dL (ref 13.0–17.0)
Immature Granulocytes: 0 %
Lymphocytes Relative: 13 %
Lymphs Abs: 0.7 10*3/uL (ref 0.7–4.0)
MCH: 32.6 pg (ref 26.0–34.0)
MCHC: 33.2 g/dL (ref 30.0–36.0)
MCV: 98.2 fL (ref 80.0–100.0)
Monocytes Absolute: 0.5 10*3/uL (ref 0.1–1.0)
Monocytes Relative: 10 %
Neutro Abs: 3.9 10*3/uL (ref 1.7–7.7)
Neutrophils Relative %: 75 %
Platelets: 117 10*3/uL — ABNORMAL LOW (ref 150–400)
RBC: 3.99 MIL/uL — ABNORMAL LOW (ref 4.22–5.81)
RDW: 11.5 % (ref 11.5–15.5)
WBC: 5.2 10*3/uL (ref 4.0–10.5)
nRBC: 0 % (ref 0.0–0.2)

## 2019-02-18 LAB — COMPREHENSIVE METABOLIC PANEL
ALT: 40 U/L (ref 0–44)
AST: 46 U/L — ABNORMAL HIGH (ref 15–41)
Albumin: 4.2 g/dL (ref 3.5–5.0)
Alkaline Phosphatase: 45 U/L (ref 38–126)
Anion gap: 6 (ref 5–15)
BUN: 9 mg/dL (ref 6–20)
CO2: 22 mmol/L (ref 22–32)
Calcium: 8.1 mg/dL — ABNORMAL LOW (ref 8.9–10.3)
Chloride: 108 mmol/L (ref 98–111)
Creatinine, Ser: 0.72 mg/dL (ref 0.61–1.24)
GFR calc Af Amer: >60 mL/min (ref >60)
GFR calc non Af Amer: >60 mL/min (ref >60)
Glucose, Bld: 79 mg/dL (ref 70–99)
Potassium: 3.7 mmol/L (ref 3.5–5.1)
Sodium: 136 mmol/L (ref 135–145)
Total Bilirubin: 1.8 mg/dL — ABNORMAL HIGH (ref 0.3–1.2)
Total Protein: 7.2 g/dL (ref 6.5–8.1)

## 2019-02-18 LAB — SARS CORONAVIRUS 2 (TAT 6-24 HRS): SARS Coronavirus 2: NEGATIVE

## 2019-02-18 LAB — MAGNESIUM: Magnesium: 2.1 mg/dL (ref 1.7–2.4)

## 2019-02-18 MED ORDER — CHLORHEXIDINE GLUCONATE CLOTH 2 % EX PADS
6.0000 | MEDICATED_PAD | Freq: Every day | CUTANEOUS | Status: DC
  Administered 2019-02-18 – 2019-02-21 (×4): 6 via TOPICAL

## 2019-02-18 MED ORDER — SODIUM CHLORIDE 0.9 % IV SOLN
INTRAVENOUS | Status: DC
  Administered 2019-02-18 – 2019-02-20 (×5): via INTRAVENOUS

## 2019-02-18 NOTE — Progress Notes (Signed)
Paged on-call MD to notify of patient's current condition. The patient is very anxious, diaphoretic, hallucinating, tachycardic, experiencing severe tremors, and attempting to get out of bed to leave. He thinks he is at a friends' house next door and not in the hospital. Patient continues to pull at wrist and belt restraints, and he has managed to loosen the belt restraint a significant amount. Monitoring closely.

## 2019-02-18 NOTE — Progress Notes (Signed)
Patient resting well. Wakes up to verbal stimuli. Answered correctly when asked if he knew where he was. Patient appears more calm and understanding of situation. Went back to sleep after brief conversation. Vitals stable, will continue to monitor.

## 2019-02-18 NOTE — Progress Notes (Signed)
Patient tolerating precedex drip well. Awakens to verbal and physical stimuli, appears anxious when woken up. Explained to him that he is in the hospital and we are monitoring him. He quickly fell back asleep. All vitals stable, will continue to monitor.

## 2019-02-18 NOTE — Progress Notes (Addendum)
NAME:  Jack Gonzales, MRN:  [id number], DOB:  Feb 17, 1991, LOS: 0 ADMISSION DATE:  02/17/2019, CONSULTATION DATE: 02/18/2019 REFERRING MD: Dr. Ramiro Harvestaniel Thompson, CHIEF COMPLAINT: Alcohol withdrawal, seizures  Brief History   28 year old with significant history of ADD, alcohol abuse.  Admitted with witnessed seizures Patient became increasingly agitated with hallucinations and started on Precedex drip.  PCCM consulted for help with management  Past Medical History  ADD, alcohol abuse  Significant Hospital Events   9/5- Admit, start Precedex  Consults:  PCCM  Procedures:    Significant Diagnostic Tests:  Urine toxicology 9/5- amphetamines CT head 9/5- no acute intracranial findings  Micro Data:  SARS-CoV-2 9/5-negative  Antimicrobials:    Interim history/subjective:    Objective   Blood pressure 124/80, pulse 87, temperature 98.1 F (36.7 C), temperature source Axillary, resp. rate 11, height 5\' 10"  (1.778 m), weight 72.6 kg, SpO2 100 %.        Intake/Output Summary (Last 24 hours) at 02/18/2019 1002 Last data filed at 02/18/2019 0600 Gross per 24 hour  Intake 3948.3 ml  Output 100 ml  Net 3848.3 ml   Filed Weights   02/17/19 1337  Weight: 72.6 kg    Examination: Gen:      No acute distress HEENT:  EOMI, sclera anicteric Neck:     No masses; no thyromegaly Lungs:    Clear to auscultation bilaterally; normal respiratory effort CV:         Regular rate and rhythm; no murmurs Abd:      + bowel sounds; soft, non-tender; no palpable masses, no distension Ext:    No edema; adequate peripheral perfusion Skin:      Warm and dry; no rash Neuro: Awake, confused  Resolved Hospital Problem list     Assessment & Plan:  Seizures.  Likely alcohol withdrawal seizures Discussed with neurology.  Adderall held Need follow-up with neurology as an outpatient  Alcohol withdrawal Continue CIWA protocol Standing ativan 2 mg q12 Precedex Thiamine, folic acid.  Best practice:   Diet: PO diet Pain/Anxiety/Delirium protocol (if indicated): Precedex VAP protocol (if indicated): Not applicable DVT prophylaxis: Lovenox GI prophylaxis: Not applicable Glucose control: Monitor sugars Mobility: Bedrest Code Status: Full Family Communication: Girlfriend updated Disposition: ICU.   Labs   CBC: Recent Labs  Lab 02/17/19 1429  WBC 6.6  HGB 13.9  HCT 41.3  MCV 96.9  PLT 131*    Basic Metabolic Panel: Recent Labs  Lab 02/17/19 1429 02/17/19 1735  NA 136  --   K 3.3*  --   CL 96*  --   CO2 25  --   GLUCOSE 85  --   BUN 15  --   CREATININE 0.73  --   CALCIUM 9.2  --   MG  --  2.1   GFR: Estimated Creatinine Clearance: 139.9 mL/min (by C-G formula based on SCr of 0.73 mg/dL). Recent Labs  Lab 02/17/19 1429  WBC 6.6    Liver Function Tests: Recent Labs  Lab 02/17/19 1444  AST 72*  ALT 57*  ALKPHOS 53  BILITOT 1.6*  PROT 8.6*  ALBUMIN 5.3*   Recent Labs  Lab 02/17/19 1444  LIPASE 34   No results for input(s): AMMONIA in the last 168 hours.  ABG No results found for: PHART, PCO2ART, PO2ART, HCO3, TCO2, ACIDBASEDEF, O2SAT   Coagulation Profile: No results for input(s): INR, PROTIME in the last 168 hours.  Cardiac Enzymes: No results for input(s): CKTOTAL, CKMB, CKMBINDEX, TROPONINI in the last  168 hours.  HbA1C: No results found for: HGBA1C  CBG: Recent Labs  Lab 02/17/19 1356  GLUCAP 85    Critical care time: 56    The patient is critically ill with multiple organ system failure and requires high complexity decision making for assessment and support, frequent evaluation and titration of therapies, advanced monitoring, review of radiographic studies and interpretation of complex data.   Critical Care Time devoted to patient care services, exclusive of separately billable procedures, described in this note is 35 minutes.   Marshell Garfinkel MD [facility] Pager [phone] If no answer call [phone]  [phone] 02/19/2019, 9:54 AM

## 2019-02-18 NOTE — Progress Notes (Signed)
PROGRESS NOTE    Jack Gonzales  VXY:[id number] DOB: Nov 12, 1990 DOA: 02/17/2019 PCP: Justin Mend, MD    Brief Narrative:  Patient is a 28 year old gentleman history of ADD on Adderall, alcohol use who presented to the ED with a reported seizure.  [place] he noticed to have shakes noticeable tremors in his hands.  Patient woke up the next day to walking outside feeling of imbalance trying to talk to his girlfriend when he suddenly fell to the ground with a witnessed emesis and regurgitation with diffuse body convulsions which lasted approximately 2 minutes.  Patient noted to be postictal after that.  Patient also noted with auditory hallucinations.  Patient presented to the ED.  Head CT done was negative.  Hospitalist were called to admit the patient for suspected alcohol withdrawal seizures.  Admitting physician discussed with neurology who recommended placing patient on Ativan 2 mg twice daily.  Patient noted to going to worsening alcohol withdrawal and transferred to the stepdown unit.  Patient's condition deteriorated and worsened and patient subsequently placed on the Precedex drip.  Will consult with PCCM on 02/18/2019.   Assessment & Plan:   Principal Problem:   Seizure (HCC) Active Problems:   ADD (attention deficit disorder)  1 seizures Patient with no prior seizure history.  Concern for alcohol withdrawal induced seizures.  Patient also noted to be on Adderall which could lower seizure threshold.  Head CT done negative.  Patient noted on admission to be diaphoretic and tremulous with worsening confusion.  Admitting physician discussed case with neurology who recommended scheduled Ativan 2 mg every 12 hours with continued Ativan withdrawal protocol and as needed Ativan.  Adderall on hold.  Patient will need outpatient follow-up with neurology post discharge and patient not to drive over the next 6 months.  Patient with no noted seizures.  Patient  with worsening alcohol withdrawal and subsequently placed on a Precedex drip.  Continue seizure precautions.  Consult with critical care.  2.  Alcohol withdrawal/alcohol abuse Patient noted to have worsening alcohol withdrawal on presentation with diaphoresis, tremulousness, confusion.  Patient also noted to be unsteady with severe tremors.  Patient was on scheduled Ativan as well as the Ativan withdrawal protocol and had to be placed on the Precedex drip.  Patient is soft restraints.  Patient diaphoretic this morning with some tremors however seems to be alert and oriented to self place and time.  Patient currently on a Precedex drip which we will continue for now.  Patient on scheduled IV Ativan.  Discontinue as needed Ativan as patient on Precedex drip for now.  IV fluids.Thiamine, folic acid.  Supportive care.  Consult with critical care medicine for further evaluation and management.  3.  ADD Patient noted to be on Adderall 20 mg twice daily.  Adderall on hold due to presentation with seizures.  Follow.  4.  Hypokalemia Magnesium level at 2.1.  Repeat labs this morning.   DVT prophylaxis: Lovenox Code Status: Full Family Communication: Updated patient.  No family at bedside. Disposition Plan: Remain in stepdown unit.   Consultants:   PCCM pending  Procedures:   CT head 02/17/2019  Antimicrobials:   None   Subjective: Events overnight noted.  No further seizures overnight.  Patient noted overnight to have worsened withdrawal symptoms and subsequently placed on the Precedex drip.  Patient somewhat diaphoretic this morning with some tremors.  Patient alert and oriented to self place and time.  Denies any  chest pain or shortness of breath.  No abdominal pain.  Objective: Vitals:   02/18/19 0600 02/18/19 0700 02/18/19 0800 02/18/19 0815  BP: 140/73 (!) 141/71 124/80   Pulse: 79 88 87   Resp: (!) 9 11 11    Temp:    98.1 F (36.7 C)  TempSrc:    Axillary  SpO2: 100% 100% 100%     Weight:      Height:        Intake/Output Summary (Last 24 hours) at 02/18/2019 0942 Last data filed at 02/18/2019 0600 Gross per 24 hour  Intake 3948.3 ml  Output 100 ml  Net 3848.3 ml   Filed Weights   02/17/19 1337  Weight: 72.6 kg    Examination:  General exam: Calm.  Diaphoretic.  Some tremors. Respiratory system: Clear to auscultation bilaterally anterior lung fields. Respiratory effort normal. Cardiovascular system: S1 & S2 heard, RRR. No JVD, murmurs, rubs, gallops or clicks. No pedal edema. Gastrointestinal system: Abdomen is nondistended, soft and nontender. No organomegaly or masses felt. Normal bowel sounds heard. Central nervous system: Alert and oriented. No focal neurological deficits. Extremities: Symmetric 5 x 5 power. Skin: No rashes, lesions or ulcers Psychiatry: Judgement and insight appear normal. Mood & affect appropriate.     Data Reviewed: I have personally reviewed following labs and imaging studies  CBC: Recent Labs  Lab 02/17/19 1429  WBC 6.6  HGB 13.9  HCT 41.3  MCV 96.9  PLT 131*   Basic Metabolic Panel: Recent Labs  Lab 02/17/19 1429 02/17/19 1735  NA 136  --   K 3.3*  --   CL 96*  --   CO2 25  --   GLUCOSE 85  --   BUN 15  --   CREATININE 0.73  --   CALCIUM 9.2  --   MG  --  2.1   GFR: Estimated Creatinine Clearance: 139.9 mL/min (by C-G formula based on SCr of 0.73 mg/dL). Liver Function Tests: Recent Labs  Lab 02/17/19 1444  AST 72*  ALT 57*  ALKPHOS 53  BILITOT 1.6*  PROT 8.6*  ALBUMIN 5.3*   Recent Labs  Lab 02/17/19 1444  LIPASE 34   No results for input(s): AMMONIA in the last 168 hours. Coagulation Profile: No results for input(s): INR, PROTIME in the last 168 hours. Cardiac Enzymes: No results for input(s): CKTOTAL, CKMB, CKMBINDEX, TROPONINI in the last 168 hours. BNP (last 3 results) No results for input(s): PROBNP in the last 8760 hours. HbA1C: No results for input(s): HGBA1C in the last 72  hours. CBG: Recent Labs  Lab 02/17/19 1356  GLUCAP 85   Lipid Profile: No results for input(s): CHOL, HDL, LDLCALC, TRIG, CHOLHDL, LDLDIRECT in the last 72 hours. Thyroid Function Tests: No results for input(s): TSH, T4TOTAL, FREET4, T3FREE, THYROIDAB in the last 72 hours. Anemia Panel: No results for input(s): VITAMINB12, FOLATE, FERRITIN, TIBC, IRON, RETICCTPCT in the last 72 hours. Sepsis Labs: No results for input(s): PROCALCITON, LATICACIDVEN in the last 168 hours.  Recent Results (from the past 240 hour(s))  SARS CORONAVIRUS 2 (TAT 6-24 HRS) Nasopharyngeal Nasopharyngeal Swab     Status: None   Collection Time: 02/17/19  6:27 PM   Specimen: Nasopharyngeal Swab  Result Value Ref Range Status   SARS Coronavirus 2 NEGATIVE NEGATIVE Final    Comment: (NOTE) SARS-CoV-2 target nucleic acids are NOT DETECTED. The SARS-CoV-2 RNA is generally detectable in upper and lower respiratory specimens during the acute phase of infection. Negative results do  not preclude SARS-CoV-2 infection, do not rule out co-infections with other pathogens, and should not be used as the sole basis for treatment or other patient management decisions. Negative results must be combined with clinical observations, patient history, and epidemiological information. The expected result is Negative. Fact Sheet for Patients: SugarRoll.be Fact Sheet for Healthcare Providers: https://www.woods-mathews.com/ This test is not yet approved or cleared by the [place] FDA and  has been authorized for detection and/or diagnosis of SARS-CoV-2 by FDA under an Emergency Use Authorization (EUA). This EUA will remain  in effect (meaning this test can be used) for the duration of the COVID-19 declaration under Section 56 4(b)(1) of the Act, 21 U.S.C. section 360bbb-3(b)(1), unless the authorization is terminated or revoked sooner. Performed at [facility], [place]  [address], [place], [place] [address]          Radiology Studies: Ct Head Wo Contrast  Result Date: 02/17/2019 CLINICAL DATA:  Seizure EXAM: CT HEAD WITHOUT CONTRAST TECHNIQUE: Contiguous axial images were obtained from the base of the skull through the vertex without intravenous contrast. COMPARISON:  None. FINDINGS: Brain: No evidence of acute infarction, hemorrhage, hydrocephalus, extra-axial collection or mass lesion/mass effect. Vascular: No hyperdense vessel or unexpected calcification. Skull: Normal. Negative for fracture or focal lesion. Sinuses/Orbits: Minimal mucosal thickening within the inferior left maxillary sinus. The visualized paranasal sinuses and mastoid air cells are otherwise clear. Orbital structures unremarkable. Other: None. IMPRESSION: No acute intracranial findings. Electronically Signed   By: Davina Poke M.D.   On: 02/17/2019 15:41        Scheduled Meds:  Chlorhexidine Gluconate Cloth  6 each Topical Daily   enoxaparin (LOVENOX) injection  40 mg Subcutaneous K93G   folic acid  1 mg Oral Daily   LORazepam  2 mg Intravenous Q12H   multivitamin with minerals  1 tablet Oral Daily   thiamine  100 mg Oral Daily   Or   thiamine  100 mg Intravenous Daily   Continuous Infusions:  dexmedetomidine (PRECEDEX) IV infusion 0.2 mcg/kg/hr (02/18/19 0629)     LOS: 0 days    Time spent: 40 minutes    Irine Seal, MD [facility]  If 7PM-7AM, please contact night-coverage [email or web address] 02/18/2019, 9:42 AM

## 2019-02-19 LAB — CBC WITH DIFFERENTIAL/PLATELET
Abs Immature Granulocytes: 0.01 10*3/uL (ref 0.00–0.07)
Basophils Absolute: 0 10*3/uL (ref 0.0–0.1)
Basophils Relative: 0 %
Eosinophils Absolute: 0.1 10*3/uL (ref 0.0–0.5)
Eosinophils Relative: 3 %
HCT: 39.6 % (ref 39.0–52.0)
Hemoglobin: 12.9 g/dL — ABNORMAL LOW (ref 13.0–17.0)
Immature Granulocytes: 0 %
Lymphocytes Relative: 19 %
Lymphs Abs: 0.9 10*3/uL (ref 0.7–4.0)
MCH: 32.4 pg (ref 26.0–34.0)
MCHC: 32.6 g/dL (ref 30.0–36.0)
MCV: 99.5 fL (ref 80.0–100.0)
Monocytes Absolute: 0.5 10*3/uL (ref 0.1–1.0)
Monocytes Relative: 11 %
Neutro Abs: 3.2 10*3/uL (ref 1.7–7.7)
Neutrophils Relative %: 67 %
Platelets: 125 10*3/uL — ABNORMAL LOW (ref 150–400)
RBC: 3.98 MIL/uL — ABNORMAL LOW (ref 4.22–5.81)
RDW: 11.5 % (ref 11.5–15.5)
WBC: 4.8 10*3/uL (ref 4.0–10.5)
nRBC: 0 % (ref 0.0–0.2)

## 2019-02-19 LAB — COMPREHENSIVE METABOLIC PANEL
ALT: 38 U/L (ref 0–44)
AST: 40 U/L (ref 15–41)
Albumin: 4.1 g/dL (ref 3.5–5.0)
Alkaline Phosphatase: 47 U/L (ref 38–126)
Anion gap: 7 (ref 5–15)
BUN: 10 mg/dL (ref 6–20)
CO2: 26 mmol/L (ref 22–32)
Calcium: 8.7 mg/dL — ABNORMAL LOW (ref 8.9–10.3)
Chloride: 105 mmol/L (ref 98–111)
Creatinine, Ser: 0.75 mg/dL (ref 0.61–1.24)
GFR calc Af Amer: >60 mL/min (ref >60)
GFR calc non Af Amer: >60 mL/min (ref >60)
Glucose, Bld: 110 mg/dL — ABNORMAL HIGH (ref 70–99)
Potassium: 3.5 mmol/L (ref 3.5–5.1)
Sodium: 138 mmol/L (ref 135–145)
Total Bilirubin: 0.8 mg/dL (ref 0.3–1.2)
Total Protein: 6.9 g/dL (ref 6.5–8.1)

## 2019-02-19 LAB — MAGNESIUM: Magnesium: 2.3 mg/dL (ref 1.7–2.4)

## 2019-02-19 MED ORDER — VITAMIN B-1 100 MG PO TABS: 100.0000 mg | ORAL_TABLET | Freq: Every day | ORAL | Status: DC

## 2019-02-19 MED ORDER — DEXMEDETOMIDINE HCL IN NACL 400 MCG/100ML IV SOLN
0.4000 ug/kg/h | INTRAVENOUS | Status: DC
  Administered 2019-02-19 – 2019-02-20 (×3): 1.2 ug/kg/h via INTRAVENOUS
  Filled 2019-02-19 (×3): qty 100

## 2019-02-19 MED ORDER — DEXMEDETOMIDINE HCL IN NACL 200 MCG/50ML IV SOLN
0.4000 ug/kg/h | INTRAVENOUS | Status: DC
  Administered 2019-02-19: 0.4 ug/kg/h via INTRAVENOUS
  Administered 2019-02-19 (×2): 1.2 ug/kg/h via INTRAVENOUS
  Filled 2019-02-19 (×3): qty 50

## 2019-02-19 MED ORDER — POTASSIUM CHLORIDE CRYS ER 20 MEQ PO TBCR
40.0000 meq | EXTENDED_RELEASE_TABLET | Freq: Once | ORAL | Status: AC
  Administered 2019-02-19: 40 meq via ORAL
  Filled 2019-02-19: qty 2

## 2019-02-19 MED ORDER — ADULT MULTIVITAMIN W/MINERALS CH: 1.0000 | ORAL_TABLET | Freq: Every day | ORAL | Status: DC

## 2019-02-19 MED ORDER — FOLIC ACID 1 MG PO TABS: 1.0000 mg | ORAL_TABLET | Freq: Every day | ORAL | Status: DC

## 2019-02-19 MED ORDER — LORAZEPAM 2 MG/ML IJ SOLN
2.0000 mg | INTRAMUSCULAR | Status: DC
  Administered 2019-02-19: 2 mg via INTRAVENOUS
  Filled 2019-02-19: qty 1

## 2019-02-19 MED ORDER — LORAZEPAM 2 MG/ML IJ SOLN
1.0000 mg | Freq: Once | INTRAMUSCULAR | Status: AC
  Administered 2019-02-19: 1 mg via INTRAVENOUS
  Filled 2019-02-19: qty 1

## 2019-02-19 MED ORDER — MIDAZOLAM HCL 2 MG/2ML IJ SOLN: 2.0000 mg | Freq: Once | INTRAMUSCULAR | Status: AC

## 2019-02-19 MED ORDER — LORAZEPAM 2 MG/ML IJ SOLN
2.0000 mg | INTRAMUSCULAR | Status: DC | PRN
  Administered 2019-02-19 (×4): 2 mg via INTRAVENOUS
  Administered 2019-02-19 (×3): 3 mg via INTRAVENOUS
  Filled 2019-02-19: qty 2
  Filled 2019-02-19 (×2): qty 1
  Filled 2019-02-19 (×2): qty 2
  Filled 2019-02-19 (×3): qty 1

## 2019-02-19 MED ORDER — AMPHETAMINE-DEXTROAMPHETAMINE 10 MG PO TABS
10.0000 mg | ORAL_TABLET | Freq: Two times a day (BID) | ORAL | Status: DC
  Administered 2019-02-19 – 2019-02-22 (×6): 10 mg via ORAL
  Filled 2019-02-19 (×6): qty 1

## 2019-02-19 MED ORDER — LORAZEPAM 2 MG/ML IJ SOLN: 1.0000 mg | Freq: Four times a day (QID) | INTRAMUSCULAR | Status: DC | PRN

## 2019-02-19 MED ORDER — THIAMINE HCL 100 MG/ML IJ SOLN: 100.0000 mg | Freq: Every day | INTRAMUSCULAR | Status: DC

## 2019-02-19 MED ORDER — MIDAZOLAM HCL 2 MG/2ML IJ SOLN
INTRAMUSCULAR | Status: AC
  Administered 2019-02-19: 2 mg
  Filled 2019-02-19: qty 2

## 2019-02-19 MED ORDER — LORAZEPAM 1 MG PO TABS: 1.0000 mg | ORAL_TABLET | Freq: Four times a day (QID) | ORAL | Status: DC | PRN

## 2019-02-19 NOTE — Progress Notes (Signed)
NAME:  Jack Gonzales, MRN:  [id number], DOB:  25-Apr-1991, LOS: 1 ADMISSION DATE:  02/17/2019, CONSULTATION DATE: 02/18/2019 REFERRING MD: Dr. Irine Seal, CHIEF COMPLAINT: Alcohol withdrawal, seizures  Brief History   28 year old with significant history of ADD, alcohol abuse.  Admitted with witnessed seizures Patient became increasingly agitated with hallucinations and started on Precedex drip.  PCCM consulted for help with management  Past Medical History  ADD, alcohol abuse  Significant Hospital Events   9/5- Admit, start Precedex  Consults:  PCCM  Procedures:    Significant Diagnostic Tests:  Urine toxicology 9/5- amphetamines CT head 9/5- no acute intracranial findings  Micro Data:  SARS-CoV-2 9/5-negative  Antimicrobials:    Interim history/subjective:  Remains delirious with confusion Restarted on Precedex drip today AM  Objective   Blood pressure (!) 145/76, pulse 83, temperature 97.6 F (36.4 C), temperature source Oral, resp. rate 20, height 5\' 10"  (1.778 m), weight 72.6 kg, SpO2 99 %.        Intake/Output Summary (Last 24 hours) at 02/19/2019 1008 Last data filed at 02/19/2019 0951 Gross per 24 hour  Intake 3149.57 ml  Output 3450 ml  Net -300.43 ml   Filed Weights   02/17/19 1337  Weight: 72.6 kg    Examination: Gen:      No acute distress HEENT:  EOMI, sclera anicteric Neck:     No masses; no thyromegaly Lungs:    Clear to auscultation bilaterally; normal respiratory effort CV:         Regular rate and rhythm; no murmurs Abd:      + bowel sounds; soft, non-tender; no palpable masses, no distension Ext:    No edema; adequate peripheral perfusion Skin:      Warm and dry; no rash Neuro: Awake, confused  Resolved Hospital Problem list     Assessment & Plan:  Seizures.  Likely alcohol withdrawal seizures Adderall held on admission. Will restart at 1/2 dose as he may be having med withdrawal Need follow-up with neurology as an outpatient   Alcohol withdrawal Continue CIWA protocol Standing ativan 2 mg q12 Wean off precedex Thiamine, folic acid.  Elevated LFTs, alcohol hepatitis > Improved Follow labs  Best practice:  Diet: PO diet Pain/Anxiety/Delirium protocol (if indicated): Precedex VAP protocol (if indicated): Not applicable DVT prophylaxis: Lovenox GI prophylaxis: Not applicable Glucose control: Monitor sugars Mobility: Bedrest Code Status: Full Family Communication: Girlfriend updated Disposition: ICU. To [facility] 9/8 if off precedex. Discussed with Dr. Grandville Silos.  Labs   CBC: Recent Labs  Lab 02/17/19 1429 02/18/19 0942 02/19/19 0221  WBC 6.6 5.2 4.8  NEUTROABS  --  3.9 3.2  HGB 13.9 13.0 12.9*  HCT 41.3 39.2 39.6  MCV 96.9 98.2 99.5  PLT 131* 117* 125*    Basic Metabolic Panel: Recent Labs  Lab 02/17/19 1429 02/17/19 1735 02/18/19 0942 02/19/19 0221  NA 136  --  136 138  K 3.3*  --  3.7 3.5  CL 96*  --  108 105  CO2 25  --  22 26  GLUCOSE 85  --  79 110*  BUN 15  --  9 10  CREATININE 0.73  --  0.72 0.75  CALCIUM 9.2  --  8.1* 8.7*  MG  --  2.1 2.1 2.3   GFR: Estimated Creatinine Clearance: 139.9 mL/min (by C-G formula based on SCr of 0.75 mg/dL). Recent Labs  Lab 02/17/19 1429 02/18/19 0942 02/19/19 0221  WBC 6.6 5.2 4.8    Liver Function Tests:  Recent Labs  Lab 02/17/19 1444 02/18/19 0942 02/19/19 0221  AST 72* 46* 40  ALT 57* 40 38  ALKPHOS 53 45 47  BILITOT 1.6* 1.8* 0.8  PROT 8.6* 7.2 6.9  ALBUMIN 5.3* 4.2 4.1   Recent Labs  Lab 02/17/19 1444  LIPASE 34   No results for input(s): AMMONIA in the last 168 hours.  ABG No results found for: PHART, PCO2ART, PO2ART, HCO3, TCO2, ACIDBASEDEF, O2SAT   Coagulation Profile: No results for input(s): INR, PROTIME in the last 168 hours.  Cardiac Enzymes: No results for input(s): CKTOTAL, CKMB, CKMBINDEX, TROPONINI in the last 168 hours.  HbA1C: No results found for: HGBA1C  CBG: Recent Labs  Lab 02/17/19 1356   GLUCAP 85    Critical care time: 3535    The patient is critically ill with multiple organ system failure and requires high complexity decision making for assessment and support, frequent evaluation and titration of therapies, advanced monitoring, review of radiographic studies and interpretation of complex data.   Critical Care Time devoted to patient care services, exclusive of separately billable procedures, described in this note is 35 minutes.   Chilton GreathousePraveen Irie Fiorello MD [facility] Pager [phone] If no answer call [phone] 02/19/2019, 10:08 AM

## 2019-02-19 NOTE — Progress Notes (Signed)
Once restraints applied, patient has become more aggressive and combative and constantly is trying to get out of bed. Precedex maxed at 1.2. Dr. Vaughan Browner paged who order 2mg  versed once and 2mg  ativan q4h scheduled.

## 2019-02-19 NOTE — Progress Notes (Signed)
Pt has become increasingly agitated and confused this afternoon. Paged Dr. Vaughan Browner and obtained an order for precedex and bilateral wrist restraints. Fiancee at bedside and made aware of plan as well as the patient.

## 2019-02-19 NOTE — Progress Notes (Signed)
Pt has disconnected his IV tubing from his IV site twice since the beginning of shift. Pt is alert with mild confusion at times.

## 2019-02-19 NOTE — Progress Notes (Signed)
Patient finally resting comfortably. VSs stable. Will continue to monitor.

## 2019-02-20 DIAGNOSIS — F10230 Alcohol dependence with withdrawal, uncomplicated: Secondary | ICD-10-CM

## 2019-02-20 DIAGNOSIS — F10231 Alcohol dependence with withdrawal delirium: Secondary | ICD-10-CM

## 2019-02-20 DIAGNOSIS — F10931 Alcohol use, unspecified with withdrawal delirium: Secondary | ICD-10-CM

## 2019-02-20 LAB — BASIC METABOLIC PANEL
Anion gap: 10 (ref 5–15)
BUN: 9 mg/dL (ref 6–20)
CO2: 22 mmol/L (ref 22–32)
Calcium: 9 mg/dL (ref 8.9–10.3)
Chloride: 106 mmol/L (ref 98–111)
Creatinine, Ser: 0.6 mg/dL — ABNORMAL LOW (ref 0.61–1.24)
GFR calc Af Amer: >60 mL/min (ref >60)
GFR calc non Af Amer: >60 mL/min (ref >60)
Glucose, Bld: 127 mg/dL — ABNORMAL HIGH (ref 70–99)
Potassium: 3.7 mmol/L (ref 3.5–5.1)
Sodium: 138 mmol/L (ref 135–145)

## 2019-02-20 LAB — CBC
HCT: 41.2 % (ref 39.0–52.0)
Hemoglobin: 13.4 g/dL (ref 13.0–17.0)
MCH: 32.2 pg (ref 26.0–34.0)
MCHC: 32.5 g/dL (ref 30.0–36.0)
MCV: 99 fL (ref 80.0–100.0)
Platelets: 123 10*3/uL — ABNORMAL LOW (ref 150–400)
RBC: 4.16 MIL/uL — ABNORMAL LOW (ref 4.22–5.81)
RDW: 11.4 % — ABNORMAL LOW (ref 11.5–15.5)
WBC: 4.2 10*3/uL (ref 4.0–10.5)
nRBC: 0 % (ref 0.0–0.2)

## 2019-02-20 LAB — HIV ANTIBODY (ROUTINE TESTING W REFLEX): HIV Screen 4th Generation wRfx: NONREACTIVE

## 2019-02-20 LAB — MRSA PCR SCREENING: MRSA by PCR: NEGATIVE

## 2019-02-20 MED ORDER — CHLORDIAZEPOXIDE HCL 25 MG PO CAPS: 25.0000 mg | ORAL_CAPSULE | Freq: Four times a day (QID) | ORAL | Status: DC | PRN

## 2019-02-20 MED ORDER — CHLORDIAZEPOXIDE HCL 25 MG PO CAPS
25.0000 mg | ORAL_CAPSULE | Freq: Three times a day (TID) | ORAL | Status: AC
  Administered 2019-02-21 (×3): 25 mg via ORAL
  Filled 2019-02-20 (×3): qty 1

## 2019-02-20 MED ORDER — CHLORDIAZEPOXIDE HCL 25 MG PO CAPS: 25.0000 mg | ORAL_CAPSULE | Freq: Every day | ORAL | Status: DC

## 2019-02-20 MED ORDER — CLONIDINE HCL 0.1 MG PO TABS
0.1000 mg | ORAL_TABLET | ORAL | Status: DC
  Administered 2019-02-22: 10:00:00 0.1 mg via ORAL

## 2019-02-20 MED ORDER — HYDRALAZINE HCL 20 MG/ML IJ SOLN
10.0000 mg | INTRAMUSCULAR | Status: DC | PRN
  Administered 2019-02-20: 10 mg via INTRAVENOUS
  Filled 2019-02-20: qty 1

## 2019-02-20 MED ORDER — ONDANSETRON 4 MG PO TBDP: 4.0000 mg | ORAL_TABLET | Freq: Four times a day (QID) | ORAL | Status: DC | PRN

## 2019-02-20 MED ORDER — CLONIDINE HCL 0.1 MG PO TABS: 0.1000 mg | ORAL_TABLET | Freq: Every day | ORAL | Status: DC

## 2019-02-20 MED ORDER — DICYCLOMINE HCL 20 MG PO TABS
20.0000 mg | ORAL_TABLET | Freq: Four times a day (QID) | ORAL | Status: DC | PRN
  Filled 2019-02-20: qty 1

## 2019-02-20 MED ORDER — CHLORDIAZEPOXIDE HCL 25 MG PO CAPS
25.0000 mg | ORAL_CAPSULE | Freq: Four times a day (QID) | ORAL | Status: AC
  Administered 2019-02-20 (×4): 25 mg via ORAL
  Filled 2019-02-20 (×4): qty 1

## 2019-02-20 MED ORDER — CHLORDIAZEPOXIDE HCL 25 MG PO CAPS
25.0000 mg | ORAL_CAPSULE | ORAL | Status: DC
  Administered 2019-02-22: 25 mg via ORAL
  Filled 2019-02-20: qty 1

## 2019-02-20 MED ORDER — CLONIDINE HCL 0.1 MG PO TABS
0.1000 mg | ORAL_TABLET | Freq: Four times a day (QID) | ORAL | Status: AC
  Administered 2019-02-20 – 2019-02-21 (×8): 0.1 mg via ORAL
  Filled 2019-02-20 (×8): qty 1

## 2019-02-20 MED ORDER — LOPERAMIDE HCL 2 MG PO CAPS: 2.0000 mg | ORAL_CAPSULE | ORAL | Status: DC | PRN

## 2019-02-20 MED ORDER — HYDROXYZINE HCL 25 MG PO TABS: 25.0000 mg | ORAL_TABLET | Freq: Four times a day (QID) | ORAL | Status: DC | PRN

## 2019-02-20 NOTE — Progress Notes (Signed)
Patient stated that he drinks 4 glasses of wine a day and 2 mixed drinks with Gin.

## 2019-02-20 NOTE — Progress Notes (Signed)
Faxed release of medical information form to medical records and then gave the paper back to the patient and Paige. Release of information needed for [facility].

## 2019-02-20 NOTE — Progress Notes (Signed)
   NAME:  Jack Gonzales, MRN:  [id number], DOB:  Sep 20, 1990, LOS: 2 ADMISSION DATE:  02/17/2019, CONSULTATION DATE: 02/18/2019 REFERRING MD: Dr. Irine Seal, CHIEF COMPLAINT: Alcohol withdrawal, seizures  Brief History   28 year old with significant history of ADD, alcohol abuse.  Admitted with witnessed seizures Patient became increasingly agitated with hallucinations and started on Precedex drip.  PCCM consulted for help with management  Past Medical History  ADD, alcohol abuse  Significant Hospital Events   9/5- Admit, start Precedex  Consults:  PCCM  Procedures:    Significant Diagnostic Tests:  Urine toxicology 9/5- amphetamines CT head 9/5- no acute intracranial findings  Micro Data:  SARS-CoV-2 9/5-negative  Antimicrobials:    Interim history/subjective:  9/8: Cooperative; but had been on high dosing of Precedex overnight.  Adding clonidine and Librium taper  Objective   Blood pressure (Abnormal) 145/93, pulse 64, temperature (Abnormal) 97.5 F (36.4 C), temperature source Oral, resp. rate (Abnormal) 9, height 5\' 10"  (1.778 m), weight 72.6 kg, SpO2 100 %.        Intake/Output Summary (Last 24 hours) at 02/20/2019 1041 Last data filed at 02/20/2019 0400 Gross per 24 hour  Intake 2253.87 ml  Output 1500 ml  Net 753.87 ml   Filed Weights   02/17/19 1337  Weight: 72.6 kg    Examination:  General this is a healthy appearing 28 year old white male currently resting in bed he is not tremulous currently HEENT normocephalic atraumatic no jugular venous distention mucous membranes are moist Pulmonary clear to auscultation without accessory use Cardiac: Regular rate and rhythm Abdomen nontender no organomegaly tolerating diet GU clear yellow Neuro currently intact and appropriate and cooperative  Resolved Hospital Problem list   Elevated LFTs, alcohol hepatitis > Improved  Assessment & Plan:  Seizures.  Likely alcohol withdrawal seizures Adderall held on  admission. Plan Cont current 1/2 dose of his adderall Will not be able to drive for 6 months F/u neuro as out-pt  Alcohol withdrawal w/ DTs -remains on high dose precedex Plan Cont precedex Add librium and clonidine taper  Keep in ICU Will need social work referral  Best practice:  Diet: PO diet Pain/Anxiety/Delirium protocol (if indicated): Precedex VAP protocol (if indicated): Not applicable DVT prophylaxis: Lovenox GI prophylaxis: Not applicable Glucose control: Monitor sugars Mobility: Bedrest Code Status: Full Family Communication: Girlfriend updated Disposition: ICU.  Remains critically ill due to DTs and intermittent agitated delirium.  Currently on high dose Precedex requiring titration.  Adding Librium and clonidine taper to assist with titration off from precedex  My cct 34 min   Erick Colace ACNP-BC [place] Pager # [phone] OR # [phone] if no answer

## 2019-02-20 NOTE — Progress Notes (Addendum)
Patient's BP 154/111 map 124. Paged Baltazar Najjar. Called E-link. New order for 10mg  IV hydralazine PRN. Will continue to monitor.

## 2019-02-20 NOTE — Progress Notes (Signed)
[facility] Progress Note Patient Name: LABAN OROURKE DOB: [birth year] MRN: [id number]   Date of Service  02/20/2019  HPI/Events of Note  Hypertension - BP = 169/105.  eICU Interventions  Will order: 1. Hydralazine 10 mg IV Q 4 hours PRN SBP > 160 or DBP > 100.     Intervention Category Major Interventions: Hypertension - evaluation and management  Trevelle Mcgurn [place] 02/20/2019, 1:02 AM

## 2019-02-21 LAB — PHOSPHORUS: Phosphorus: 4.3 mg/dL (ref 2.5–4.6)

## 2019-02-21 LAB — MAGNESIUM: Magnesium: 2.1 mg/dL (ref 1.7–2.4)

## 2019-02-21 NOTE — Progress Notes (Signed)
Patient to transfer to [facility] report given to receiving nurse, all questions answered at this time.  Pt. VSS with no s/s of distress noted.  Pt. Stable at transfer.

## 2019-02-21 NOTE — Progress Notes (Signed)
   NAME:  Jack Gonzales, MRN:  [id number], DOB:  [birth year], LOS: 3 ADMISSION DATE:  02/17/2019, CONSULTATION DATE: 02/18/2019 REFERRING MD: Dr. Irine Seal, CHIEF COMPLAINT: Alcohol withdrawal, seizures  Brief History   28 year old with significant history of ADD, alcohol abuse.  Admitted with witnessed seizures Patient became increasingly agitated with hallucinations and started on Precedex drip.  PCCM consulted for help with management  Past Medical History  ADD, alcohol abuse  Significant Hospital Events   9/5- Admit, start Precedex  Consults:  PCCM  Procedures:    Significant Diagnostic Tests:  Urine toxicology 9/5- amphetamines CT head 9/5- no acute intracranial findings  Micro Data:  SARS-CoV-2 9/5-negative  Antimicrobials:    Interim history/subjective:  9/8: Cooperative; but had been on high dosing of Precedex overnight.  Adding clonidine and Librium taper 9/9: Is been off Precedex since afternoon on 9/8 still a little tremulous but able to control symptoms with Librium taper.  Feeling better. Objective   Blood pressure (Abnormal) 162/82, pulse (Abnormal) 111, temperature 98.7 F (37.1 C), temperature source Oral, resp. rate 13, height 5\' 10"  (1.778 m), weight 72.6 kg, SpO2 100 %.        Intake/Output Summary (Last 24 hours) at 02/21/2019 0917 Last data filed at 02/21/2019 0744 Gross per 24 hour  Intake 2011.69 ml  Output 3950 ml  Net -1938.31 ml   Filed Weights   02/17/19 1337  Weight: 72.6 kg    Examination: General: This is a pleasant 28 year old white male is currently resting in bed he remains somewhat tremulous but overall appears better than yesterday. HEENT normocephalic atraumatic no jugular venous distention sclera nonicteric no JVD mucous membranes moist Pulmonary: Clear to auscultation without accessory use Cardiac: Regular rate and rhythm without murmur rub or gallop Abdomen: Soft nontender Neuro: Awake, oriented x3, appropriate and  cooperative but still remains a little tremulous Extremities warm and dry brisk capillary refill   Resolved Hospital Problem list   Elevated LFTs, alcohol hepatitis > Improved  Assessment & Plan:  Seizures.  Likely alcohol withdrawal seizures Adderall held on admission. Plan Okay to resume his normal Adderall dosing He will not be able to drive for 6 months Needs follow-up with neurology  Alcohol withdrawal w/ DTs -He is now off Precedex, he remains tremulous, he is calm and appropriate.  I am not sure we are out of the woods just yet, I still worry about breakthrough agitation necessitating PRN Librium Plan Discontinue Precedex from medication profile Continue Librium and clonidine taper Okay to move out of the intensive care Social work referrals been made for [facility] [employer] practice:  Diet: PO diet Pain/Anxiety/Delirium protocol (if indicated): Precedex VAP protocol (if indicated): Not applicable DVT prophylaxis: Lovenox GI prophylaxis: Not applicable Glucose control: Monitor sugars Mobility: Bedrest Code Status: Full Family Communication: Girlfriend updated Disposition: No longer critically ill will discontinue Precedex continue Librium and clonidine taper asking internal medicine to resume care  Erick Colace ACNP-BC [place] Pager # [phone] OR # [phone] if no answer

## 2019-02-22 LAB — MAGNESIUM: Magnesium: 2.2 mg/dL (ref 1.7–2.4)

## 2019-02-22 LAB — PHOSPHORUS: Phosphorus: 4.6 mg/dL (ref 2.5–4.6)

## 2019-02-22 MED ORDER — CHLORDIAZEPOXIDE HCL 25 MG PO CAPS: ORAL_CAPSULE | ORAL | 0 refills | Status: AC

## 2019-02-22 NOTE — Discharge Summary (Signed)
Physician Discharge Summary  Jack Gonzales HXT:[id number] DOB: 1990/12/16 DOA: 02/17/2019  PCP: Justin Mend, MD  Admit date: 02/17/2019 Discharge date: 02/22/2019  Admitted From: Home Disposition: Home  Recommendations for Outpatient Follow-up:  1. Follow up with PCP in 1-2 weeks 2. Please obtain BMP/CBC in one week your next doctors visit.  3. Librium taper prescribed.  He is joining alcohol rehab program tomorrow.   Discharge Condition: Stable CODE STATUS: Full code Diet recommendation: Advised to refrain from using alcohol.  Brief/Interim Summary: 28 year old with history of ADD, alcohol abuse came to the hospital after having witnessed seizure.  CT of the head was negative.  Urine toxicology showed amphetamines.  Due to severe withdrawal secondary to seizure, agitation and hallucinations he was admitted to ICU on Precedex drip.  He did well over the course of few days and was taken off Precedex.  On the day of discharge he felt much better without any signs of withdrawal his last drink he tells me was 8 days ago from the day of discharge.  Tells me he is joining alcohol rehab program tomorrow is only scheduled for it.  We will discharge him on Librium taper.    Discharge Diagnoses:  Principal Problem:   Seizure (HCC) Active Problems:   ADD (attention deficit disorder)   Hypokalemia   Alcohol dependence with uncomplicated withdrawal (HCC)   Alcohol abuse   DTs (delirium tremens) (HCC)  Severe alcohol withdrawal with seizures Alcohol abuse -Initially requiring Precedex drip was in the ICU.  Now resolved.  I recommended him not to drive for 6 months.  Librium taper has been prescribed for 3 days.  Joining alcohol rehab program tomorrow.  Emphasized and advised to abstain himself from using alcohol.  Consultations:  Critical care  Subjective: No complaints no acute events overnight.  During my examination he was up and ready to go home as he felt much better.  Discharge  Exam: Vitals:   02/22/19 0531 02/22/19 0745  BP: 110/83 133/83  Pulse: (!) 59 84  Resp: 18 18  Temp: 97.7 F (36.5 C) 97.7 F (36.5 C)  SpO2: 100% 100%   Vitals:   02/21/19 1557 02/21/19 2041 02/22/19 0531 02/22/19 0745  BP: (!) 143/84 139/87 110/83 133/83  Pulse: 90 85 (!) 59 84  Resp: 19 18 18 18   Temp: 98.3 F (36.8 C) 98 F (36.7 C) 97.7 F (36.5 C) 97.7 F (36.5 C)  TempSrc: Oral Oral Oral Oral  SpO2: 99% 100% 100% 100%  Weight:      Height:        General: Pt is alert, awake, not in acute distress Cardiovascular: RRR, S1/S2 +, no rubs, no gallops Respiratory: CTA bilaterally, no wheezing, no rhonchi Abdominal: Soft, NT, ND, bowel sounds + Extremities: no edema, no cyanosis  Discharge Instructions   Allergies as of 02/22/2019   No Known Allergies     Medication List    TAKE these medications   amphetamine-dextroamphetamine 20 MG tablet Commonly known as: Adderall Take 1 tablet (20 mg total) by mouth 2 (two) times daily.   chlordiazePOXIDE 25 MG capsule Commonly known as: LIBRIUM Take 1 capsule (25 mg total) by mouth 3 (three) times daily for 1 day, THEN 1 capsule (25 mg total) 2 (two) times daily for 1 day, THEN 1 capsule (25 mg total) daily for 1 day. Start taking on: February 22, 2019      Follow-up Information    Schedule an appointment as soon as possible for  a visit  with Justin MendBrayboy, Jacob R, MD.   Specialty: Family Medicine Why: Please follow up with PCP next week regarding new onset seizure Contact information: [address] [place] [address] [phone]          No Known Allergies  You were cared for by a hospitalist during your hospital stay. If you have any questions about your discharge medications or the care you received while you were in the hospital after you are discharged, you can call the unit and asked to speak with the hospitalist on call if the hospitalist that took care of you is not available. Once you are  discharged, your primary care physician will handle any further medical issues. Please note that no refills for any discharge medications will be authorized once you are discharged, as it is imperative that you return to your primary care physician (or establish a relationship with a primary care physician if you do not have one) for your aftercare needs so that they can reassess your need for medications and monitor your lab values.   Procedures/Studies: Ct Head Wo Contrast  Result Date: 02/17/2019 CLINICAL DATA:  Seizure EXAM: CT HEAD WITHOUT CONTRAST TECHNIQUE: Contiguous axial images were obtained from the base of the skull through the vertex without intravenous contrast. COMPARISON:  None. FINDINGS: Brain: No evidence of acute infarction, hemorrhage, hydrocephalus, extra-axial collection or mass lesion/mass effect. Vascular: No hyperdense vessel or unexpected calcification. Skull: Normal. Negative for fracture or focal lesion. Sinuses/Orbits: Minimal mucosal thickening within the inferior left maxillary sinus. The visualized paranasal sinuses and mastoid air cells are otherwise clear. Orbital structures unremarkable. Other: None. IMPRESSION: No acute intracranial findings. Electronically Signed   By: Duanne GuessNicholas  Plundo M.D.   On: 02/17/2019 15:41      The results of significant diagnostics from this hospitalization (including imaging, microbiology, ancillary and laboratory) are listed below for reference.     Microbiology: Recent Results (from the past 240 hour(s))  SARS CORONAVIRUS 2 (TAT 6-24 HRS) Nasopharyngeal Nasopharyngeal Swab     Status: None   Collection Time: 02/17/19  6:27 PM   Specimen: Nasopharyngeal Swab  Result Value Ref Range Status   SARS Coronavirus 2 NEGATIVE NEGATIVE Final    Comment: (NOTE) SARS-CoV-2 target nucleic acids are NOT DETECTED. The SARS-CoV-2 RNA is generally detectable in upper and lower respiratory specimens during the acute phase of infection.  Negative results do not preclude SARS-CoV-2 infection, do not rule out co-infections with other pathogens, and should not be used as the sole basis for treatment or other patient management decisions. Negative results must be combined with clinical observations, patient history, and epidemiological information. The expected result is Negative. Fact Sheet for Patients: HairSlick.nohttps://www.fda.gov/media/138098/download Fact Sheet for Healthcare Providers: quierodirigir.comhttps://www.fda.gov/media/138095/download This test is not yet approved or cleared by the [place] FDA and  has been authorized for detection and/or diagnosis of SARS-CoV-2 by FDA under an Emergency Use Authorization (EUA). This EUA will remain  in effect (meaning this test can be used) for the duration of the COVID-19 declaration under Section 56 4(b)(1) of the Act, 21 U.S.C. section 360bbb-3(b)(1), unless the authorization is terminated or revoked sooner. Performed at [facility], [address] [place], [place] [address]   MRSA PCR Screening     Status: None   Collection Time: 02/20/19 12:08 PM   Specimen: Nasal Mucosa; Nasopharyngeal  Result Value Ref Range Status   MRSA by PCR NEGATIVE NEGATIVE Final    Comment:  The GeneXpert MRSA Assay (FDA approved for NASAL specimens only), is one component of a comprehensive MRSA colonization surveillance program. It is not intended to diagnose MRSA infection nor to guide or monitor treatment for MRSA infections. Performed at [facility], [address] [place], [place] [address]      Labs: BNP (last 3 results) No results for input(s): BNP in the last 8760 hours. Basic Metabolic Panel: Recent Labs  Lab 02/17/19 1429 02/17/19 1735 02/18/19 0942 02/19/19 0221 02/20/19 0200 02/21/19 0207 02/22/19 0320  NA 136  --  136 138 138  --   --   K 3.3*  --  3.7 3.5 3.7  --   --   CL 96*  --  108 105 106  --   --   CO2 25  --  22 26 22   --   --    GLUCOSE 85  --  79 110* 127*  --   --   BUN 15  --  9 10 9   --   --   CREATININE 0.73  --  0.72 0.75 0.60*  --   --   CALCIUM 9.2  --  8.1* 8.7* 9.0  --   --   MG  --  2.1 2.1 2.3  --  2.1 2.2  PHOS  --   --   --   --   --  4.3 4.6   Liver Function Tests: Recent Labs  Lab 02/17/19 1444 02/18/19 0942 02/19/19 0221  AST 72* 46* 40  ALT 57* 40 38  ALKPHOS 53 45 47  BILITOT 1.6* 1.8* 0.8  PROT 8.6* 7.2 6.9  ALBUMIN 5.3* 4.2 4.1   Recent Labs  Lab 02/17/19 1444  LIPASE 34   No results for input(s): AMMONIA in the last 168 hours. CBC: Recent Labs  Lab 02/17/19 1429 02/18/19 0942 02/19/19 0221 02/20/19 0200  WBC 6.6 5.2 4.8 4.2  NEUTROABS  --  3.9 3.2  --   HGB 13.9 13.0 12.9* 13.4  HCT 41.3 39.2 39.6 41.2  MCV 96.9 98.2 99.5 99.0  PLT 131* 117* 125* 123*   Cardiac Enzymes: No results for input(s): CKTOTAL, CKMB, CKMBINDEX, TROPONINI in the last 168 hours. BNP: Invalid input(s): POCBNP CBG: Recent Labs  Lab 02/17/19 1356  GLUCAP 85   D-Dimer No results for input(s): DDIMER in the last 72 hours. Hgb A1c No results for input(s): HGBA1C in the last 72 hours. Lipid Profile No results for input(s): CHOL, HDL, LDLCALC, TRIG, CHOLHDL, LDLDIRECT in the last 72 hours. Thyroid function studies No results for input(s): TSH, T4TOTAL, T3FREE, THYROIDAB in the last 72 hours.  Invalid input(s): FREET3 Anemia work up No results for input(s): VITAMINB12, FOLATE, FERRITIN, TIBC, IRON, RETICCTPCT in the last 72 hours. Urinalysis No results found for: COLORURINE, APPEARANCEUR, LABSPEC, PHURINE, GLUCOSEU, HGBUR, BILIRUBINUR, KETONESUR, PROTEINUR, UROBILINOGEN, NITRITE, LEUKOCYTESUR Sepsis Labs Invalid input(s): PROCALCITONIN,  WBC,  LACTICIDVEN Microbiology Recent Results (from the past 240 hour(s))  SARS CORONAVIRUS 2 (TAT 6-24 HRS) Nasopharyngeal Nasopharyngeal Swab     Status: None   Collection Time: 02/17/19  6:27 PM   Specimen: Nasopharyngeal Swab  Result Value Ref  Range Status   SARS Coronavirus 2 NEGATIVE NEGATIVE Final    Comment: (NOTE) SARS-CoV-2 target nucleic acids are NOT DETECTED. The SARS-CoV-2 RNA is generally detectable in upper and lower respiratory specimens during the acute phase of infection. Negative results do not preclude SARS-CoV-2 infection, do not rule out co-infections with other pathogens, and  should not be used as the sole basis for treatment or other patient management decisions. Negative results must be combined with clinical observations, patient history, and epidemiological information. The expected result is Negative. Fact Sheet for Patients: SugarRoll.be Fact Sheet for Healthcare Providers: https://www.woods-mathews.com/ This test is not yet approved or cleared by the [place] FDA and  has been authorized for detection and/or diagnosis of SARS-CoV-2 by FDA under an Emergency Use Authorization (EUA). This EUA will remain  in effect (meaning this test can be used) for the duration of the COVID-19 declaration under Section 56 4(b)(1) of the Act, 21 U.S.C. section 360bbb-3(b)(1), unless the authorization is terminated or revoked sooner. Performed at [facility], [place] [address] [place], [place] [address]   MRSA PCR Screening     Status: None   Collection Time: 02/20/19 12:08 PM   Specimen: Nasal Mucosa; Nasopharyngeal  Result Value Ref Range Status   MRSA by PCR NEGATIVE NEGATIVE Final    Comment:        The GeneXpert MRSA Assay (FDA approved for NASAL specimens only), is one component of a comprehensive MRSA colonization surveillance program. It is not intended to diagnose MRSA infection nor to guide or monitor treatment for MRSA infections. Performed at [facility], [place] [address] [place],  [address]      Time coordinating discharge:  I have spent 35 minutes face to face with the patient and on the ward discussing the  patients care, assessment, plan and disposition with other care givers. >50% of the time was devoted counseling the patient about the risks and benefits of treatment/Discharge disposition and coordinating care.   SIGNED:   Damita Lack, MD  [facility] 02/22/2019, 11:29 AM   If 7PM-7AM, please contact night-coverage [email or web address]

## 2020-10-29 IMAGING — CT CT HEAD W/O CM
3 series · 14 of 47 positions shown, 16 images · non-contrast
Comparison: None.

CLINICAL DATA: Seizure

EXAM:
CT HEAD WITHOUT CONTRAST
TECHNIQUE: Contiguous axial images were obtained from the base of the skull
through the vertex without intravenous contrast.

[Series 2: head wo · axial · 0.47mm/px · z∈[-102,+28]mm · 8 of 32 slices shown, 10 images]
[im 3/32  brain]
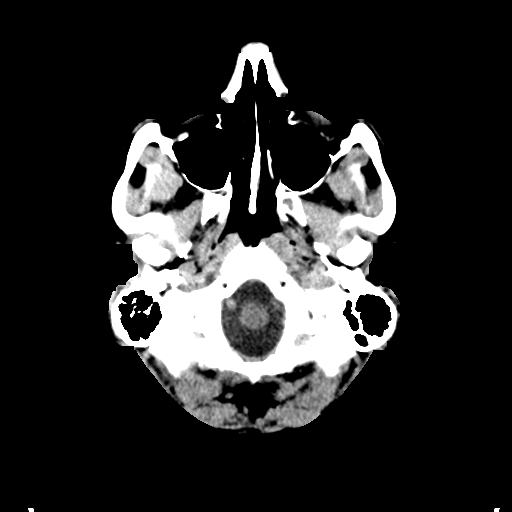
[im 3/32  bone]
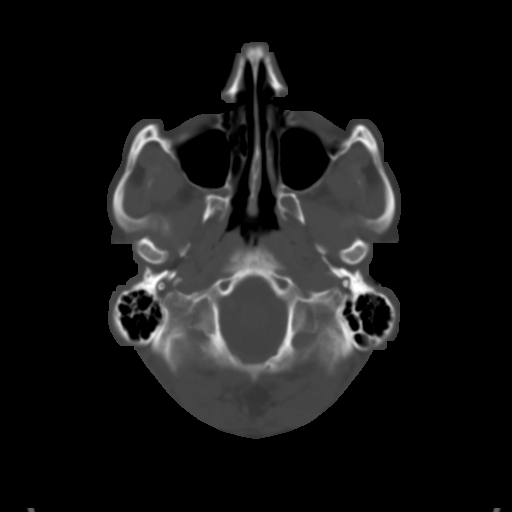
[im 7/32  brain]
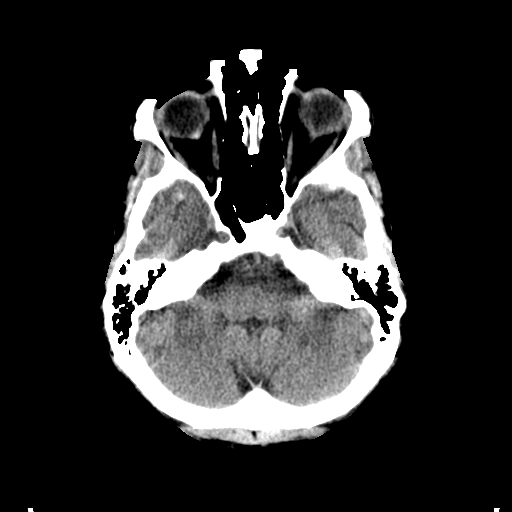
[im 10/32  brain]
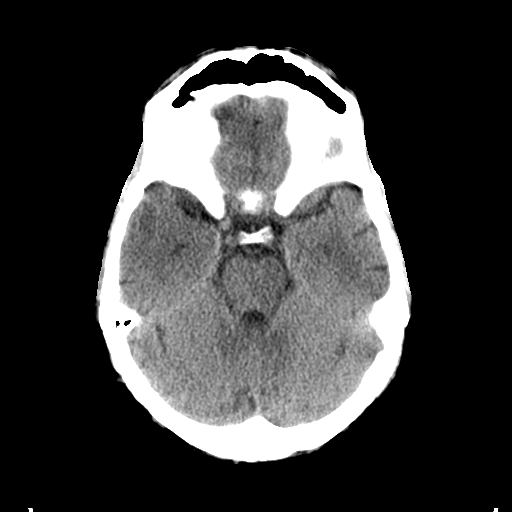
[im 14/32  brain]
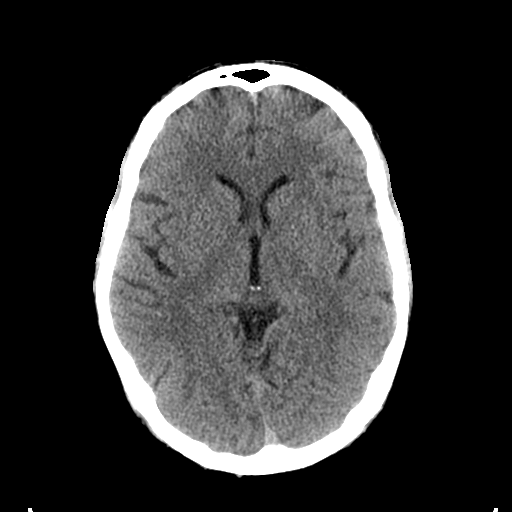
[im 18/32  brain]
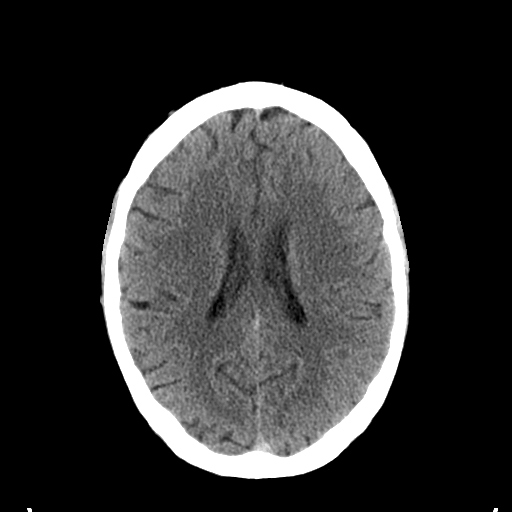
[im 18/32  bone]
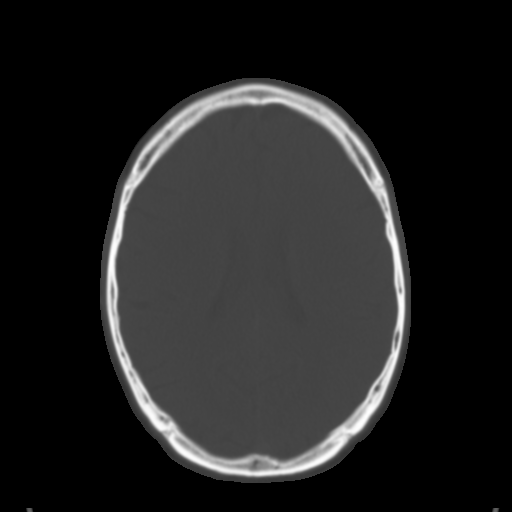
[im 22/32  brain]
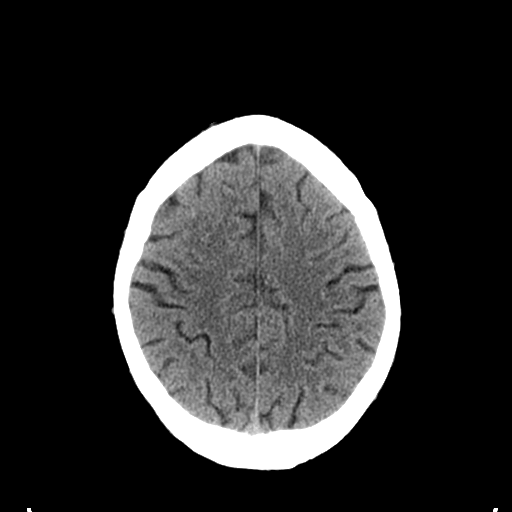
[im 25/32  brain]
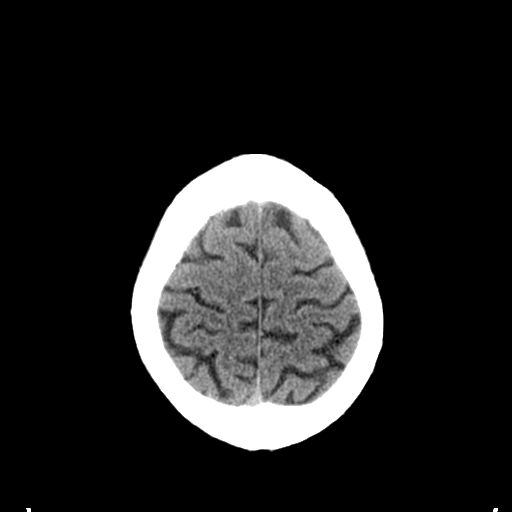
[im 29/32  brain]
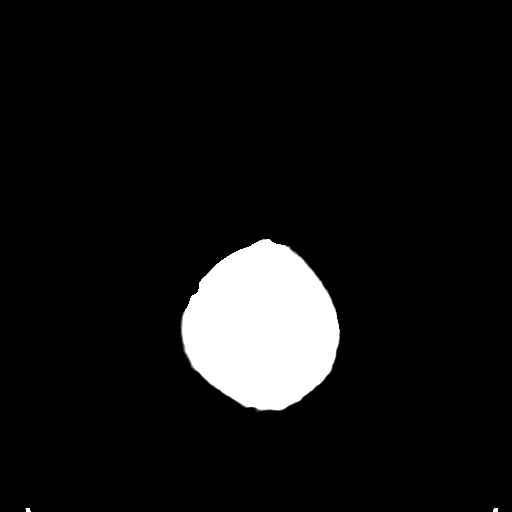

[Series 4: coronal soft tissue · coronal · 0.30mm/px · 3 of 68 slices shown]
[im 23/68  brain]
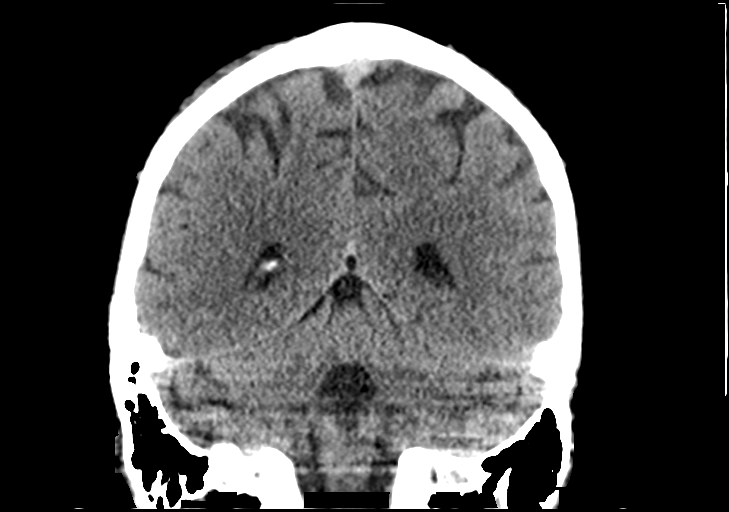
[im 30/68  brain]
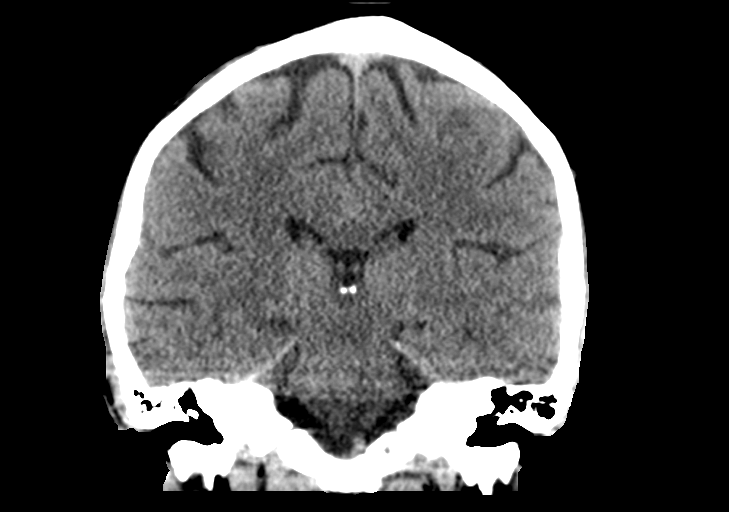
[im 38/68  brain]
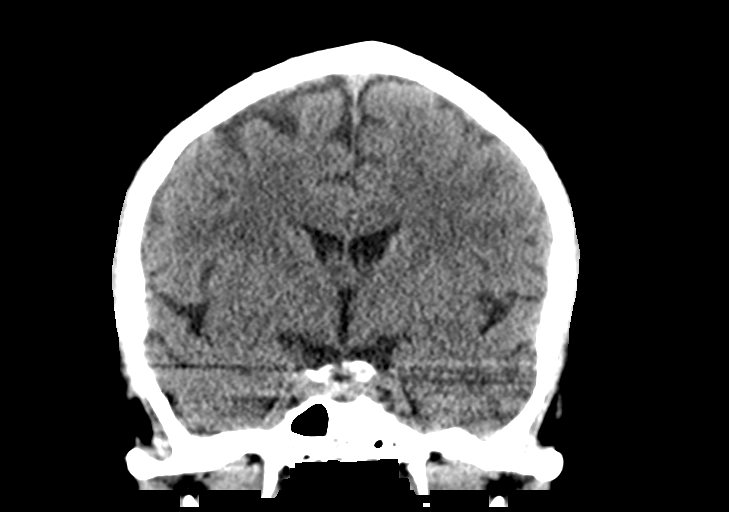

[Series 5: sagittal soft tissue · sagittal · 0.38mm/px · 3 of 53 slices shown]
[im 18/53  brain]
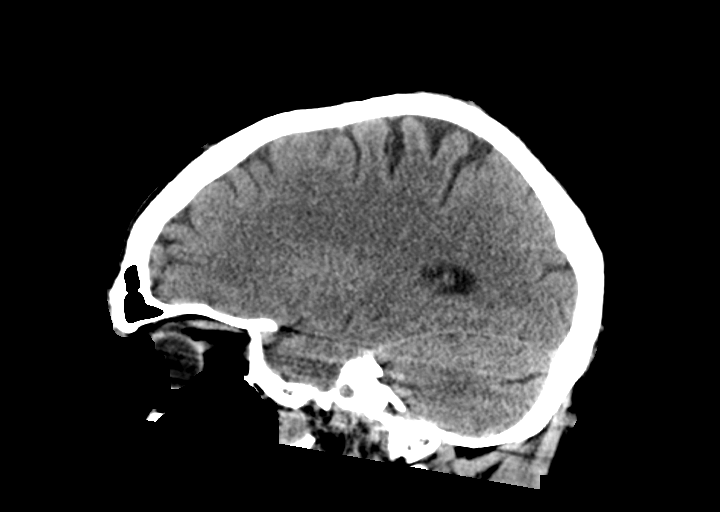
[im 27/53  brain]
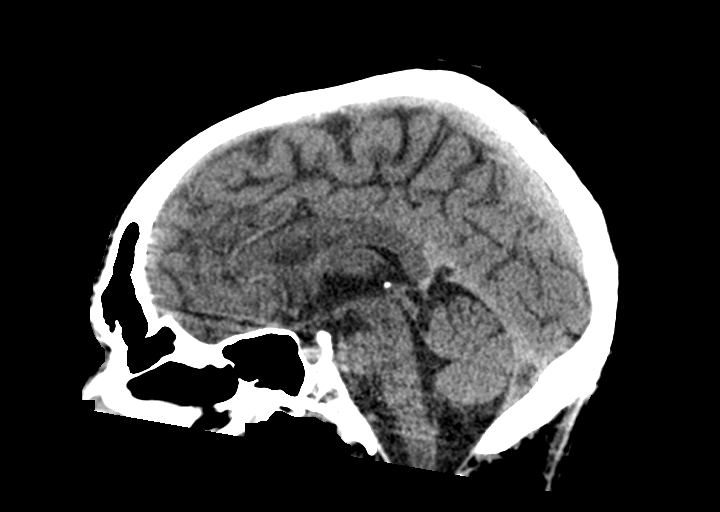
[im 35/53  brain]
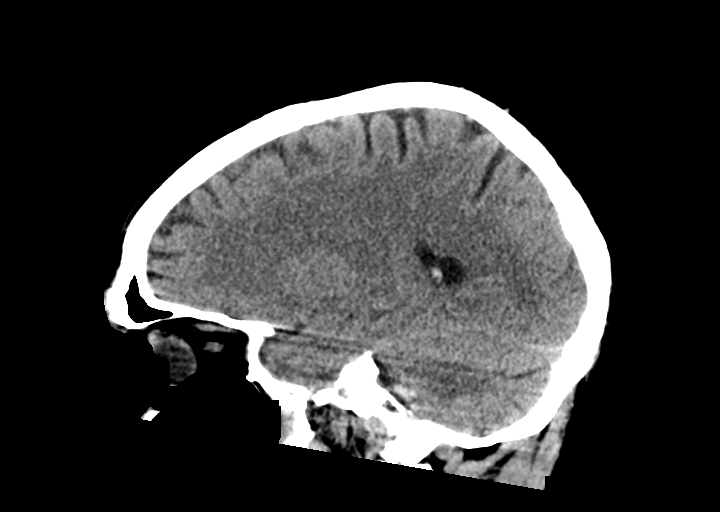

[14 of 47 positions shown; findings below may reference images not displayed]

FINDINGS: Brain: No evidence of acute infarction, hemorrhage, hydrocephalus,
extra-axial collection or mass lesion/mass effect.

Vascular: No hyperdense vessel or unexpected calcification.

Skull: Normal. Negative for fracture or focal lesion.

Sinuses/Orbits: Minimal mucosal thickening within the inferior left
maxillary sinus. The visualized paranasal sinuses and mastoid air
cells are otherwise clear. Orbital structures unremarkable.

Other: None.
IMPRESSION: No acute intracranial findings.
